# Patient Record
Sex: Female | Born: 1955
Health system: Southern US, Community
[De-identification: ages and names within clinical notes are randomized; demographics above are authoritative.]

## PROBLEM LIST (undated history)

## (undated) DIAGNOSIS — R12 Heartburn: Secondary | ICD-10-CM

## (undated) DIAGNOSIS — F329 Major depressive disorder, single episode, unspecified: Secondary | ICD-10-CM

## (undated) DIAGNOSIS — F32A Depression, unspecified: Secondary | ICD-10-CM

## (undated) DIAGNOSIS — E559 Vitamin D deficiency, unspecified: Secondary | ICD-10-CM

## (undated) DIAGNOSIS — M2550 Pain in unspecified joint: Secondary | ICD-10-CM

## (undated) DIAGNOSIS — M1990 Unspecified osteoarthritis, unspecified site: Secondary | ICD-10-CM

## (undated) DIAGNOSIS — M549 Dorsalgia, unspecified: Secondary | ICD-10-CM

## (undated) DIAGNOSIS — Z91018 Allergy to other foods: Secondary | ICD-10-CM

## (undated) DIAGNOSIS — R0602 Shortness of breath: Secondary | ICD-10-CM

## (undated) DIAGNOSIS — G43909 Migraine, unspecified, not intractable, without status migrainosus: Secondary | ICD-10-CM

## (undated) HISTORY — DX: Dorsalgia, unspecified: M54.9

## (undated) HISTORY — DX: Vitamin D deficiency, unspecified: E55.9

## (undated) HISTORY — PX: SHOULDER SURGERY: SHX246

## (undated) HISTORY — PX: KNEE SURGERY: SHX244

## (undated) HISTORY — DX: Pain in unspecified joint: M25.50

## (undated) HISTORY — DX: Shortness of breath: R06.02

## (undated) HISTORY — DX: Migraine, unspecified, not intractable, without status migrainosus: G43.909

## (undated) HISTORY — DX: Allergy to other foods: Z91.018

## (undated) HISTORY — DX: Depression, unspecified: F32.A

## (undated) HISTORY — DX: Heartburn: R12

## (undated) HISTORY — DX: Unspecified osteoarthritis, unspecified site: M19.90

---
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
## 1999-07-30 ENCOUNTER — Encounter: Payer: Self-pay | Admitting: Internal Medicine

## 1999-07-30 ENCOUNTER — Encounter: Admission: RE | Admit: 1999-07-30 | Discharge: 1999-07-30 | Payer: Self-pay | Admitting: Internal Medicine

## 2000-05-12 ENCOUNTER — Encounter (INDEPENDENT_AMBULATORY_CARE_PROVIDER_SITE_OTHER): Payer: Self-pay | Admitting: Specialist

## 2000-05-12 ENCOUNTER — Other Ambulatory Visit: Admission: RE | Admit: 2000-05-12 | Discharge: 2000-05-12 | Payer: Self-pay | Admitting: Obstetrics and Gynecology

## 2001-01-01 ENCOUNTER — Other Ambulatory Visit: Admission: RE | Admit: 2001-01-01 | Discharge: 2001-01-01 | Payer: Self-pay | Admitting: Obstetrics and Gynecology

## 2007-03-16 ENCOUNTER — Emergency Department (HOSPITAL_COMMUNITY): Admission: EM | Admit: 2007-03-16 | Discharge: 2007-03-16 | Payer: Self-pay | Admitting: Emergency Medicine

## 2007-03-30 ENCOUNTER — Ambulatory Visit (HOSPITAL_COMMUNITY): Admission: RE | Admit: 2007-03-30 | Discharge: 2007-03-30 | Payer: Self-pay | Admitting: Gastroenterology

## 2010-09-03 ENCOUNTER — Ambulatory Visit: Payer: BC Managed Care – PPO | Attending: Family Medicine | Admitting: Physical Therapy

## 2010-09-03 DIAGNOSIS — IMO0001 Reserved for inherently not codable concepts without codable children: Secondary | ICD-10-CM | POA: Insufficient documentation

## 2010-09-03 DIAGNOSIS — M25519 Pain in unspecified shoulder: Secondary | ICD-10-CM | POA: Insufficient documentation

## 2010-09-03 DIAGNOSIS — M25619 Stiffness of unspecified shoulder, not elsewhere classified: Secondary | ICD-10-CM | POA: Insufficient documentation

## 2011-03-21 LAB — URINALYSIS, ROUTINE W REFLEX MICROSCOPIC
Bilirubin Urine: NEGATIVE
Glucose, UA: NEGATIVE
Hgb urine dipstick: NEGATIVE
Ketones, ur: NEGATIVE
Nitrite: NEGATIVE
Protein, ur: NEGATIVE
Specific Gravity, Urine: 1.01
Urobilinogen, UA: 0.2
pH: 7.5

## 2011-03-21 LAB — CBC
HCT: 42
Hemoglobin: 14.5
MCHC: 34.4
MCV: 87.2
Platelets: 244
RBC: 4.82
RDW: 14
WBC: 8.2

## 2011-03-21 LAB — URINE MICROSCOPIC-ADD ON

## 2011-03-21 LAB — DIFFERENTIAL
Eosinophils Relative: 1
Lymphocytes Relative: 21
Lymphs Abs: 1.7
Monocytes Relative: 6

## 2013-05-26 ENCOUNTER — Other Ambulatory Visit: Payer: Self-pay | Admitting: Family Medicine

## 2013-05-26 DIAGNOSIS — R1012 Left upper quadrant pain: Secondary | ICD-10-CM

## 2013-05-28 ENCOUNTER — Ambulatory Visit
Admission: RE | Admit: 2013-05-28 | Discharge: 2013-05-28 | Disposition: A | Discharge status: Home / Self Care | Payer: BC Managed Care – PPO | Source: Ambulatory Visit | Attending: Family Medicine | Admitting: Family Medicine

## 2013-05-28 DIAGNOSIS — R1012 Left upper quadrant pain: Secondary | ICD-10-CM

## 2013-05-28 MED ORDER — IOHEXOL 300 MG/ML  SOLN: 100.0000 mL | Freq: Once | INTRAMUSCULAR | Status: AC | PRN

## 2013-12-27 IMAGING — CT CT ABDOMEN W/ CM
3 of 5 series · 15 of 32 positions shown, 19 images · IV contrast (READICAT/WATER & [ID] OMNI 300)
Comparison: CT of the abdomen and pelvis 03/09/2007.

CLINICAL DATA: Worsening left upper quadrant abdominal pain.
Left-sided flank pain.

EXAM:
CT ABDOMEN WITH CONTRAST
TECHNIQUE: Multidetector CT imaging of the abdomen was performed using the
standard protocol following bolus administration of intravenous
contrast.
CONTRAST:  100 mL of Omnipaque 300.

[Series 2: abd/pelvis with · axial · 0.86mm/px · z∈[-189,-44]mm · 3 of 59 slices shown, 7 images]
[im 15/59  soft-tissue]
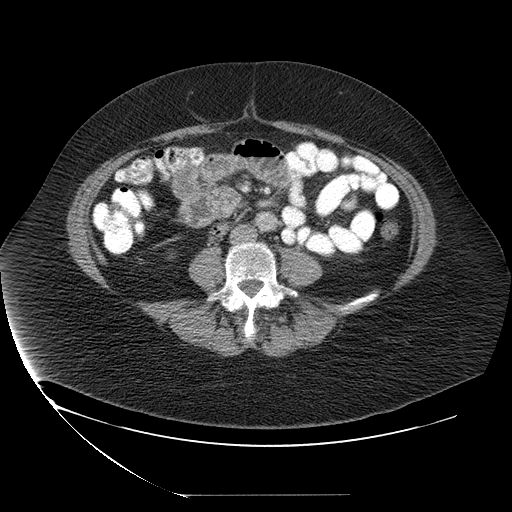
[im 15/59  lung]
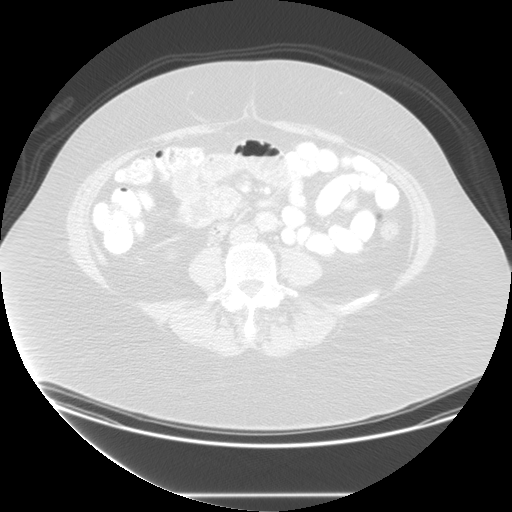
[im 15/59  bone]
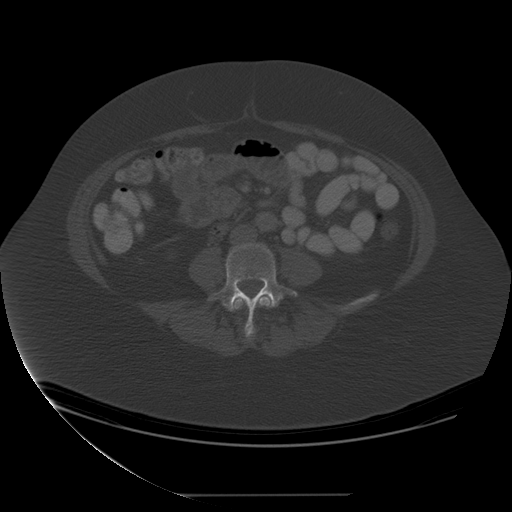
[im 30/59  soft-tissue]
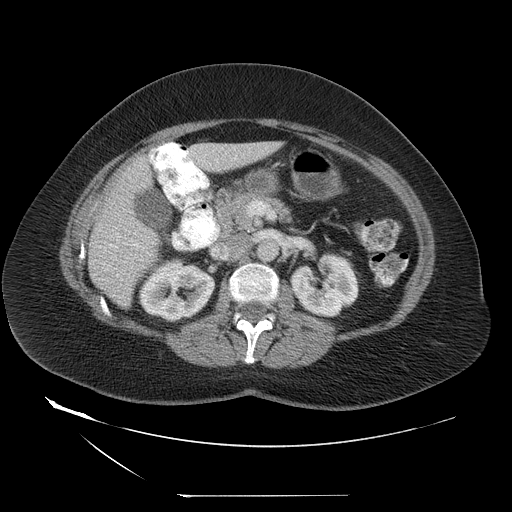
[im 30/59  lung]
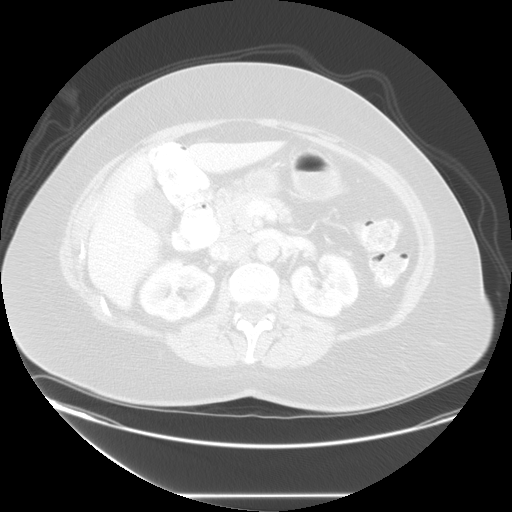
[im 44/59  soft-tissue]
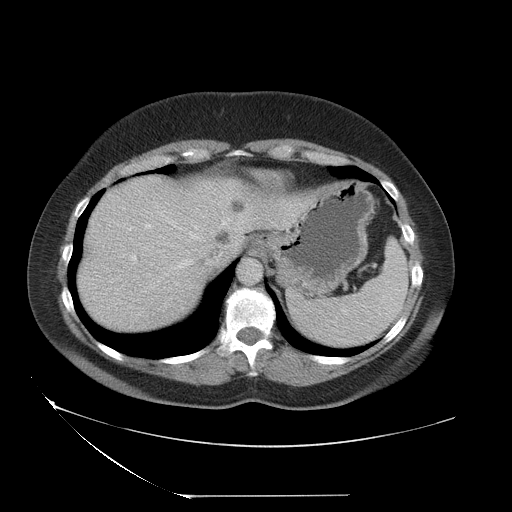
[im 44/59  lung]
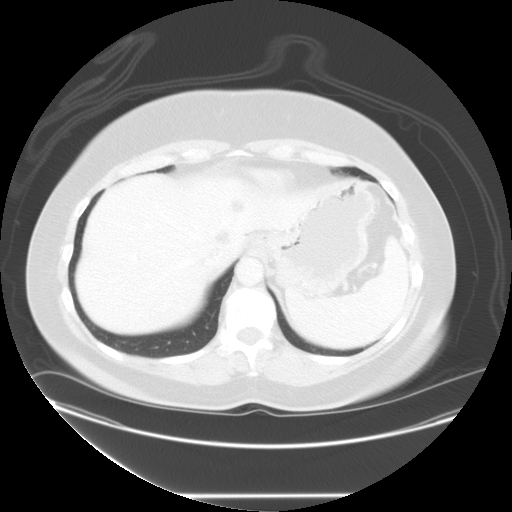

[Series 400: sag · sagittal · 0.86mm/px · 8 of 167 slices shown]
[im 14/167  soft-tissue]
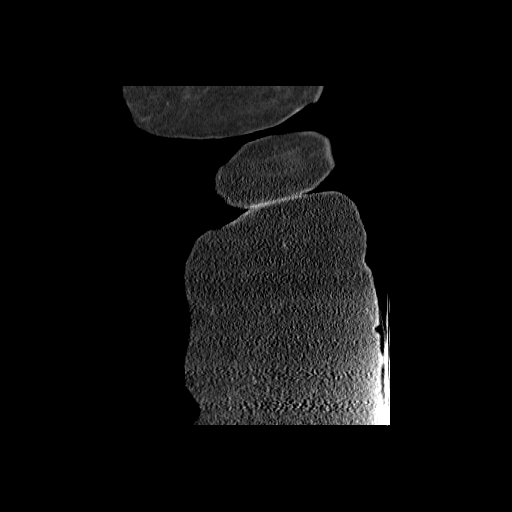
[im 42/167  soft-tissue]
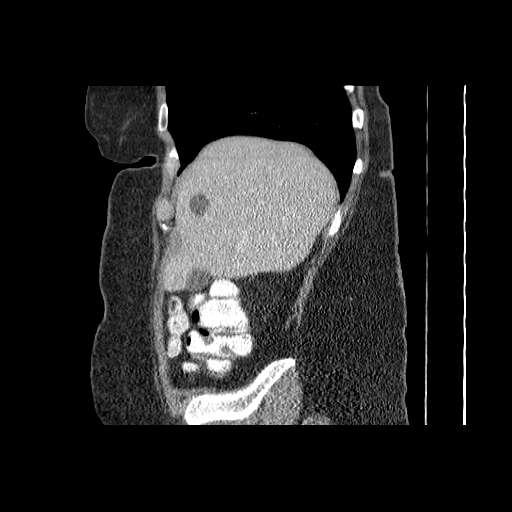
[im 56/167  soft-tissue]
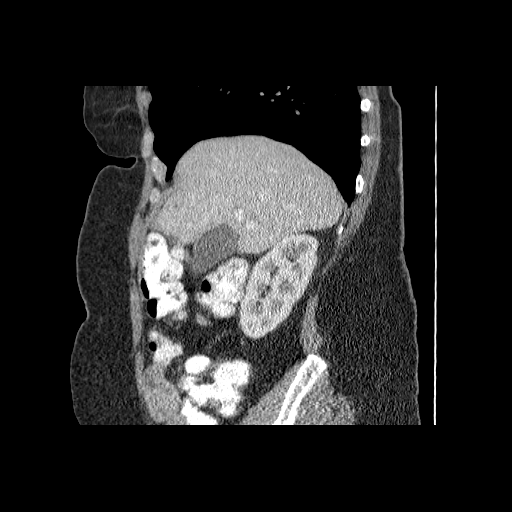
[im 70/167  soft-tissue]
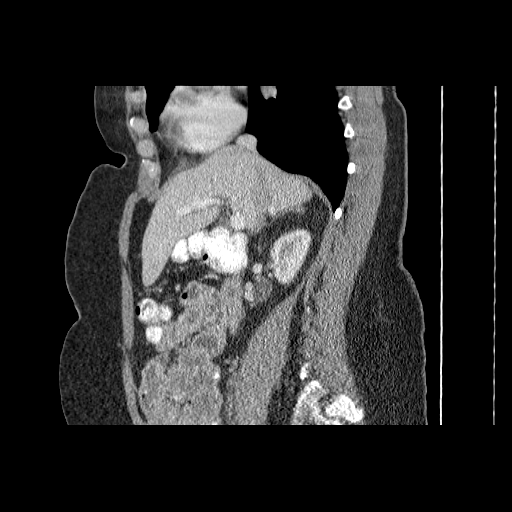
[im 97/167  soft-tissue]
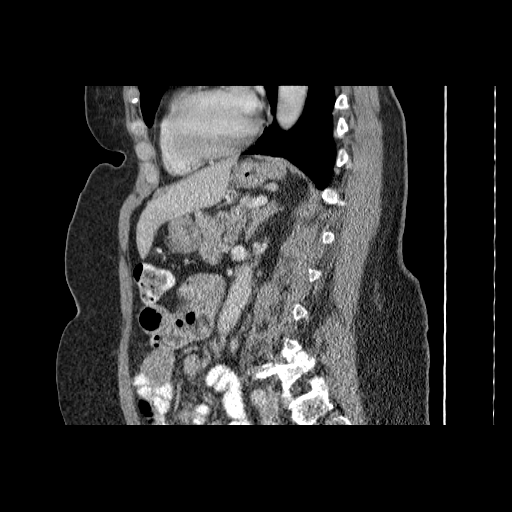
[im 111/167  soft-tissue]
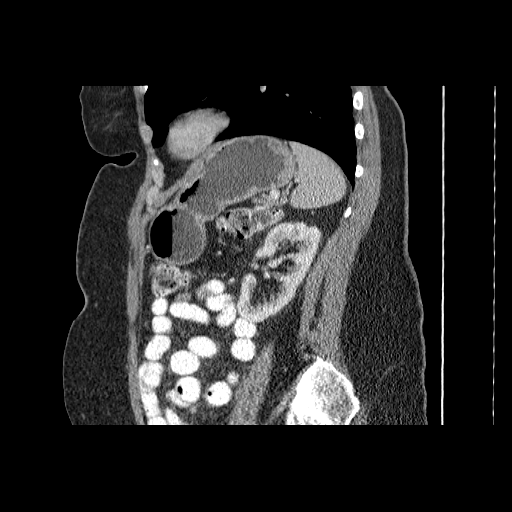
[im 125/167  soft-tissue]
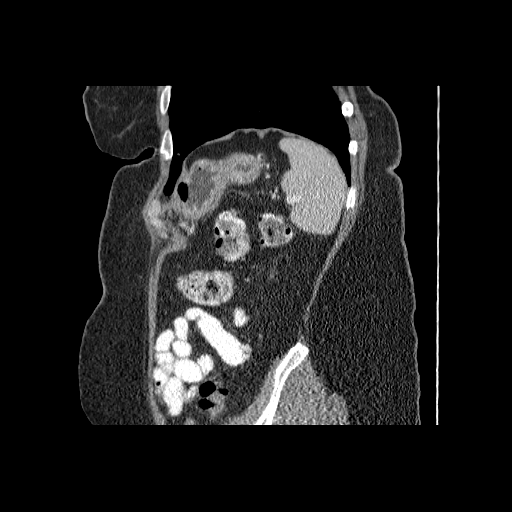
[im 153/167  soft-tissue]
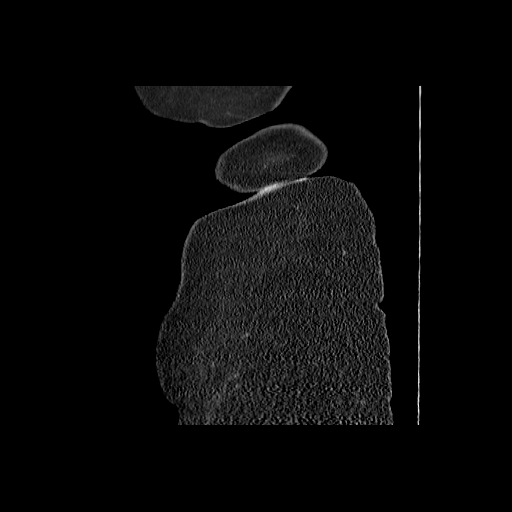

[Series 401: cor · coronal · 0.86mm/px · 4 of 120 slices shown]
[im 14/120  soft-tissue]
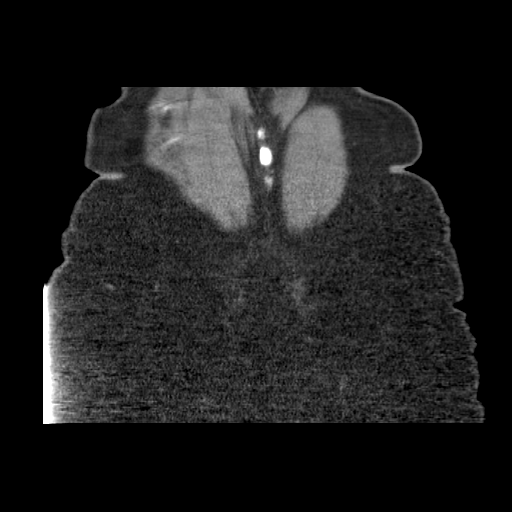
[im 27/120  soft-tissue]
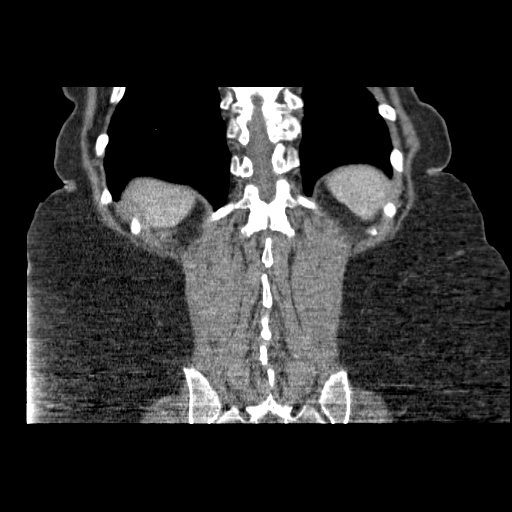
[im 40/120  soft-tissue]
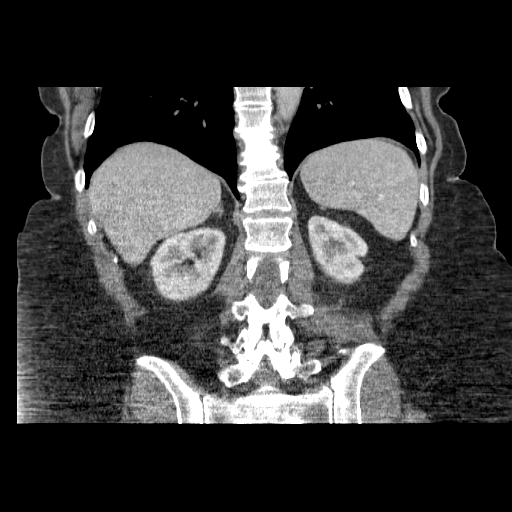
[im 53/120  soft-tissue]
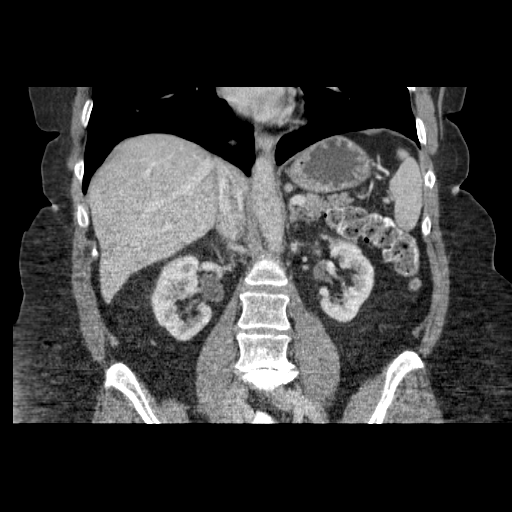

[15 of 32 positions shown; findings below may reference images not displayed]

FINDINGS: Lung Bases: Mild scarring in the inferior segment of the lingula and
medial segment of the right middle lobe.

Abdomen: Several small low-attenuation lesions in the liver are
predominantly too small to definitively characterize. The largest
hepatic lesion measures up to 2.1 cm in diameter in the central
liver at the junction of segments 4B and 5, with imaging
characteristics compatible with a small simple cyst. The other
hepatic lesions are similar in size, number and distribution to
remote prior study from 03/09/2007, therefore favored to represent
small cysts. The appearance of the gallbladder, pancreas, spleen,
bilateral adrenal glands and bilateral kidneys is unremarkable.

Within the visualized portions of the peritoneal cavity there is no
significant volume of ascites, no pneumoperitoneum and no pathologic
distention of small bowel. No definite lymphadenopathy in the
abdomen.

Musculoskeletal: There are no aggressive appearing lytic or blastic
lesions noted in the visualized portions of the skeleton. 6 mm of
anterolisthesis of L4 upon L5.
IMPRESSION: 1. No acute findings in the abdomen to account for the patient's
symptoms.
2. 6 mm of anterolisthesis of L4 upon L5.
3. Additional incidental findings, as above.

## 2017-11-10 DIAGNOSIS — M1712 Unilateral primary osteoarthritis, left knee: Secondary | ICD-10-CM | POA: Insufficient documentation

## 2018-12-30 DIAGNOSIS — Z1231 Encounter for screening mammogram for malignant neoplasm of breast: Secondary | ICD-10-CM | POA: Diagnosis not present

## 2019-01-26 DIAGNOSIS — Z1321 Encounter for screening for nutritional disorder: Secondary | ICD-10-CM | POA: Diagnosis not present

## 2019-01-26 DIAGNOSIS — Z13220 Encounter for screening for lipoid disorders: Secondary | ICD-10-CM | POA: Diagnosis not present

## 2019-01-26 DIAGNOSIS — R0600 Dyspnea, unspecified: Secondary | ICD-10-CM | POA: Diagnosis not present

## 2019-01-26 DIAGNOSIS — R079 Chest pain, unspecified: Secondary | ICD-10-CM | POA: Diagnosis not present

## 2019-01-26 DIAGNOSIS — G43909 Migraine, unspecified, not intractable, without status migrainosus: Secondary | ICD-10-CM | POA: Diagnosis not present

## 2019-01-26 DIAGNOSIS — Z1329 Encounter for screening for other suspected endocrine disorder: Secondary | ICD-10-CM | POA: Diagnosis not present

## 2019-02-04 DIAGNOSIS — R079 Chest pain, unspecified: Secondary | ICD-10-CM | POA: Diagnosis not present

## 2019-02-04 DIAGNOSIS — Z1211 Encounter for screening for malignant neoplasm of colon: Secondary | ICD-10-CM | POA: Diagnosis not present

## 2019-02-05 ENCOUNTER — Encounter: Payer: Self-pay | Admitting: Cardiology

## 2019-02-07 DIAGNOSIS — R079 Chest pain, unspecified: Secondary | ICD-10-CM | POA: Insufficient documentation

## 2019-02-07 DIAGNOSIS — R0602 Shortness of breath: Secondary | ICD-10-CM | POA: Insufficient documentation

## 2019-02-07 NOTE — Progress Notes (Signed)
Cardiology Office Note   Date:  02/08/2019   ID:  Deanna Hale, DOB [birth year], MRN [id number]  PCP:  Jamesetta Orleans, PA-C  Cardiologist:   Minus Breeding, MD Referring:  Jamesetta Orleans, PA-C   Chief Complaint  Patient presents with  . Chest Pain      History of Present Illness: Deanna Hale is a 63 y.o. female who is referred by Jamesetta Orleans, PA-C for evaluation of chest pain.  He has had no prior cardiac history although there is some family history of atrial fibrillation and what sounds like her brother having an ascending aortic aneurysm related to probably a bicuspid valve.  There is not an early onset coronary artery disease history however.  She has been getting chest discomfort.  This seems to have been going on for many months.  Has constant discomfort.  It can be 3 out of 10 in intensity.  It does not really wax and wane.  It is left upper chest tightness up into her neck.  Interestingly he has been wool into yarn  and has a repetitive motion associated with that activity on the left side.  She does paddle kayak and walks.  She does not necessarily bring on any symptoms with this.  She cannot induce any of the symptoms and she does notice a little more discomfort when she turns her head to the right.  She denies any associated nausea vomiting or diaphoresis.  She has no palpitations, presyncope or syncope.  She has no PND or orthopnea.  She is not had any prior cardiac testing other than a Holter monitor which demonstrated PVCs in the past.  She does feel the palpitations but this is been a stable pattern.  She does not describe presyncope or syncope.  Past Medical History:  Diagnosis Date  . Depression   . Migraines     Past Surgical History Shoulder surgery , Knee surgery  Current Outpatient Medications  Medication Sig Dispense Refill  . buPROPion (WELLBUTRIN) 75 MG tablet Take 75 mg by mouth 2 (two) times daily.    . SUMAtriptan (IMITREX) 100 MG tablet Take  100-200 mg by mouth every 2 (two) hours as needed for migraine. May repeat in 2 hours if headache persists or recurs.     No current facility-administered medications for this visit.     Allergies:   Fish-derived products and Penicillins    Social History:  The patient  reports that she has never smoked. She has never used smokeless tobacco. She reports current alcohol use. She reports that she does not use drugs.   Family History:  The patient's family history includes Aortic aneurysm (age of onset: 67) in her brother; Atrial fibrillation in her sister; Bladder Cancer (age of onset: 63) in her father; Depression in her brother and son; Heart failure (age of onset: 66) in her mother; Valvular heart disease in her brother.    ROS:  Please see the history of present illness.   Otherwise, review of systems are positive for none.   All other systems are reviewed and negative.    PHYSICAL EXAM: VS:  BP 128/90   Pulse 99   Temp (!) 97.3 F (36.3 C)   Ht 5\' 5"  (1.651 m)   Wt 210 lb (95.3 kg)   SpO2 98%   BMI 34.95 kg/m  , BMI Body mass index is 34.95 kg/m. GENERAL:  Well appearing HEENT:  Pupils equal round and reactive, fundi not  visualized, oral mucosa unremarkable NECK:  No jugular venous distention, waveform within normal limits, carotid upstroke brisk and symmetric, no bruits, no thyromegaly LYMPHATICS:  No cervical, inguinal adenopathy LUNGS:  Clear to auscultation bilaterally BACK:  No CVA tenderness CHEST:  Unremarkable HEART:  PMI not displaced or sustained,S1 and S2 within normal limits, no S3, no S4, no clicks, no rubs, no murmurs ABD:  Flat, positive bowel sounds normal in frequency in pitch, no bruits, no rebound, no guarding, no midline pulsatile mass, no hepatomegaly, no splenomegaly EXT:  2 plus pulses throughout, no edema, no cyanosis no clubbing SKIN:  No rashes no nodules NEURO:  Cranial nerves II through XII grossly intact, motor grossly intact throughout PSYCH:   Cognitively intact, oriented to person place and time    EKG:  EKG is not ordered today. The ekg ordered 01/26/19 demonstrates sinus rhythm, rate 69, axis within normal limits, intervals within normal limits, no acute ST-T wave changes.   Recent Labs: No results found for requested labs within last 8760 hours.    Lipid Panel No results found for: CHOL, TRIG, HDL, CHOLHDL, VLDL, LDLCALC, LDLDIRECT    Wt Readings from Last 3 Encounters:  02/08/19 210 lb (95.3 kg)      Other studies Reviewed: Additional studies/ records that were reviewed today include: Office records and labs. Review of the above records demonstrates:  Please see elsewhere in the note.     ASSESSMENT AND PLAN:  CHEST PAIN: This is atypical.  She has a normal exam.  She has an unremarkable EKG except for slightly leftward axis.  She does have some risk factors with mildly elevated LDL as below.  The pretest probability of obstructive coronary disease is low. I will bring the patient back for a POET (Plain Old Exercise Test). This will allow me to screen for obstructive coronary disease, risk stratify and very importantly provide a prescription for exercise.  ELEVATED LDL: She does have a mildly elevated LDL at 125 with an HDL of 79.  Given the good ratio no change in therapy is indicated.   Current medicines are reviewed at length with the patient today.  The patient does not have concerns regarding medicines.  The following changes have been made:  no change  Labs/ tests ordered today include:   Orders Placed This Encounter  Procedures  . Exercise Tolerance Test     Disposition:   FU with me as needed.      Signed, Minus Breeding, MD  02/08/2019 3:15 PM    [place]

## 2019-02-08 ENCOUNTER — Ambulatory Visit (INDEPENDENT_AMBULATORY_CARE_PROVIDER_SITE_OTHER): Payer: PPO | Admitting: Cardiology

## 2019-02-08 ENCOUNTER — Other Ambulatory Visit: Payer: Self-pay

## 2019-02-08 ENCOUNTER — Encounter: Payer: Self-pay | Admitting: Cardiology

## 2019-02-08 VITALS — BP 128/90 | HR 99 | Temp 97.3°F | Ht 65.0 in | Wt 210.0 lb

## 2019-02-08 DIAGNOSIS — R0602 Shortness of breath: Secondary | ICD-10-CM | POA: Diagnosis not present

## 2019-02-08 DIAGNOSIS — R0789 Other chest pain: Secondary | ICD-10-CM

## 2019-02-08 DIAGNOSIS — R079 Chest pain, unspecified: Secondary | ICD-10-CM

## 2019-02-08 NOTE — Patient Instructions (Addendum)
Medication Instructions:  Your physician recommends that you continue on your current medications as directed. Please refer to the Current Medication list given to you today.  If you need a refill on your cardiac medications before your next appointment, please call your pharmacy.   Lab work: COVID 19 SCREENING 3 Ali Chukson   If you have labs (blood work) drawn today and your tests are completely normal, you will receive your results only by: Marland Kitchen MyChart Message (if you have MyChart) OR . A paper copy in the mail If you have any lab test that is abnormal or we need to change your treatment, we will call you to review the results.  Testing/Procedures: Your physician has requested that you have an exercise tolerance test. For further information please visit [email or web address]. Please also follow instruction sheet, as given.   Follow-Up: AS NEEDED   Any Other Special Instructions Will Be Listed Below (If Applicable).   Exercise Stress Test An exercise stress test is a test to check how your heart works during exercise. You will need to walk on a treadmill or ride an exercise bike for this test. An electrocardiogram (ECG) will record your heartbeat when you are at rest and when you are exercising. You may have an ultrasound or nuclear test after the exercise test. The test is done to check for coronary artery disease (CAD). It is also done to:  See how well you can exercise.  Watch for high blood pressure during exercise.  Test how well you can exercise after treatment.  Check the blood flow to your arms and legs. If your test result is not normal, more testing may be needed. What happens before the procedure?  Follow instructions from your doctor about what you cannot eat or drink. ? Do not have any drinks or foods that have caffeine in them for 24 hours before the test, or as told by your doctor. This includes coffee, tea (even decaf tea),  sodas, chocolate, and cocoa.  Ask your doctor about changing or stopping your normal medicines. This is important if you: ? Take diabetes medicines. ? Take beta-blocker medicines. ? Wear a nitroglycerin patch.  If you use an inhaler, bring it with you to the test.  Do not put lotions, powders, creams, or oils on your chest before the test.  Wear comfortable shoes and clothing.  Do not use any products that have nicotine or tobacco in them, such as cigarettes and e-cigarettes. Stop using them at least 4 hours before the test. If you need help quitting, ask your doctor. What happens during the procedure?   Patches (electrodes) will be put on your chest.  Wires will be connected to the patches. The wires will send signals to a machine to record your heartbeat.  Your heart rate will be watched while you are resting and while you are exercising. Your blood pressure will also be watched during the test.  You will walk on a treadmill or use a stationary bike. If you cannot use these, you may be asked to turn a crank with your hands.  The activity will get harder and will raise your heart rate.  You may be asked to breathe into a tube a few times during the test. This measures the gases that you breathe out.  You will be asked how you are feeling throughout the test.  You will exercise until your heart reaches a target heart rate. You will stop early  if: ? You feel dizzy. ? You have chest pain. ? You are out of breath. ? Your blood pressure is too high or too low. ? You have an irregular heartbeat. ? You have pain or aching in your arms or legs. The procedure may vary among doctors and hospitals. What happens after the procedure?  Your blood pressure, heart rate, breathing rate, and blood oxygen level will be watched after the test.  You may return to your normal diet and activities as told by your doctor.  It is up to you to get the results of your test. Ask your doctor, or the  department that is doing the test, when your results will be ready. Summary  An exercise stress test is a test to check how your heart works during exercise.  This test is done to check for coronary artery disease.  Your heart rate will be watched while you are resting and while you are exercising.  Follow instructions from your doctor about what you cannot eat or drink before the test. This information is not intended to replace advice given to you by your health care provider. Make sure you discuss any questions you have with your health care provider. Document Released: 11/13/2007 Document Revised: 09/08/2018 Document Reviewed: 08/27/2016 Elsevier Patient Education  2020 [employer].

## 2019-02-10 ENCOUNTER — Telehealth: Payer: Self-pay

## 2019-02-10 NOTE — Telephone Encounter (Signed)
   [facility] Pre-operative Risk Assessment    Request for surgical clearance:  1. What type of surgery is being performed? COLONOSCOPY   2. When is this surgery scheduled? 04-12-2019   3. What type of clearance is required (medical clearance vs. Pharmacy clearance to hold med vs. Both)? MEDICAL  4. Are there any medications that need to be held prior to surgery and how long? NO   5. Practice name and name of physician performing surgery? [facility]   6. What is your office phone number [phone]    7.   What is your office fax number [phone]  8.   Anesthesia type (None, local, MAC, general) ? PROPOFOL   Waylan Rocher 02/10/2019, 1:56 PM  _________________________________________________________________   (provider comments below)

## 2019-02-12 DIAGNOSIS — L814 Other melanin hyperpigmentation: Secondary | ICD-10-CM | POA: Diagnosis not present

## 2019-02-12 DIAGNOSIS — L570 Actinic keratosis: Secondary | ICD-10-CM | POA: Diagnosis not present

## 2019-02-12 DIAGNOSIS — L821 Other seborrheic keratosis: Secondary | ICD-10-CM | POA: Diagnosis not present

## 2019-02-12 DIAGNOSIS — D1801 Hemangioma of skin and subcutaneous tissue: Secondary | ICD-10-CM | POA: Diagnosis not present

## 2019-02-12 DIAGNOSIS — L738 Other specified follicular disorders: Secondary | ICD-10-CM | POA: Diagnosis not present

## 2019-02-12 DIAGNOSIS — D225 Melanocytic nevi of trunk: Secondary | ICD-10-CM | POA: Diagnosis not present

## 2019-02-12 DIAGNOSIS — D485 Neoplasm of uncertain behavior of skin: Secondary | ICD-10-CM | POA: Diagnosis not present

## 2019-02-16 NOTE — Telephone Encounter (Signed)
Colonoscopy scheduled for Nov. Recently seen by Dr. Percival Spanish for atypical chest pain, pending POET on 9/18. Patient will be cleared if POET is low risk.

## 2019-02-23 ENCOUNTER — Other Ambulatory Visit (HOSPITAL_COMMUNITY)
Admission: RE | Admit: 2019-02-23 | Discharge: 2019-02-23 | Disposition: A | Discharge status: Home / Self Care | Payer: PPO | Source: Ambulatory Visit | Attending: Cardiology | Admitting: Cardiology

## 2019-02-23 DIAGNOSIS — Z01812 Encounter for preprocedural laboratory examination: Secondary | ICD-10-CM | POA: Insufficient documentation

## 2019-02-23 DIAGNOSIS — Z20828 Contact with and (suspected) exposure to other viral communicable diseases: Secondary | ICD-10-CM | POA: Diagnosis not present

## 2019-02-24 ENCOUNTER — Telehealth (HOSPITAL_COMMUNITY): Payer: Self-pay

## 2019-02-24 LAB — NOVEL CORONAVIRUS, NAA (HOSP ORDER, SEND-OUT TO REF LAB; TAT 18-24 HRS): SARS-CoV-2, NAA: NOT DETECTED

## 2019-02-24 NOTE — Telephone Encounter (Signed)
Encounter complete. 

## 2019-02-26 ENCOUNTER — Other Ambulatory Visit: Payer: Self-pay

## 2019-02-26 ENCOUNTER — Ambulatory Visit (HOSPITAL_COMMUNITY)
Admission: RE | Admit: 2019-02-26 | Discharge: 2019-02-26 | Disposition: A | Discharge status: Home / Self Care | Payer: PPO | Source: Ambulatory Visit | Attending: Cardiology | Admitting: Cardiology

## 2019-02-26 DIAGNOSIS — R0602 Shortness of breath: Secondary | ICD-10-CM

## 2019-02-26 DIAGNOSIS — R079 Chest pain, unspecified: Secondary | ICD-10-CM

## 2019-02-26 DIAGNOSIS — R0789 Other chest pain: Secondary | ICD-10-CM | POA: Diagnosis not present

## 2019-02-28 LAB — EXERCISE TOLERANCE TEST
Estimated workload: 4.6 METS
Exercise duration (min): 3 min
Exercise duration (sec): 2 s
MPHR: 155 {beats}/min
Peak HR: 151 {beats}/min
Percent HR: 97 %
RPE: 19
Rest HR: 103 {beats}/min

## 2019-03-01 NOTE — Telephone Encounter (Signed)
   Primary Cardiologist: Minus Breeding, MD  Chart reviewed as part of pre-operative protocol coverage. Given past medical history and time since last visit, based on ACC/AHA guidelines, Deanna Hale would be at acceptable risk for the planned procedure without further cardiovascular testing. Recent exercise tolerance test was negative, patient was able to achieve 4.6 METS of activity.  I will route this recommendation to the requesting party via Epic fax function and remove from pre-op pool.  Please call with questions.  Almyra Deforest, [place] 03/01/2019, 8:37 AM

## 2019-03-01 NOTE — Telephone Encounter (Signed)
see note on athesir, please inform patient that her recent treadmill stress test was low risk, however she has poor exercise tolerance. Also that she is cleared to proceed with colonoscopy  Left detailed message as above to CB if further explanation is needed. Almyra Deforest, PA-C forwarded to requesting party.

## 2019-03-09 DIAGNOSIS — F329 Major depressive disorder, single episode, unspecified: Secondary | ICD-10-CM | POA: Diagnosis not present

## 2019-04-12 DIAGNOSIS — D122 Benign neoplasm of ascending colon: Secondary | ICD-10-CM | POA: Diagnosis not present

## 2019-04-12 DIAGNOSIS — D128 Benign neoplasm of rectum: Secondary | ICD-10-CM | POA: Diagnosis not present

## 2019-04-12 DIAGNOSIS — K635 Polyp of colon: Secondary | ICD-10-CM | POA: Diagnosis not present

## 2019-04-12 DIAGNOSIS — Z1211 Encounter for screening for malignant neoplasm of colon: Secondary | ICD-10-CM | POA: Diagnosis not present

## 2019-04-12 DIAGNOSIS — K621 Rectal polyp: Secondary | ICD-10-CM | POA: Diagnosis not present

## 2019-07-02 ENCOUNTER — Ambulatory Visit: Payer: PPO | Attending: Internal Medicine

## 2019-07-02 DIAGNOSIS — Z23 Encounter for immunization: Secondary | ICD-10-CM | POA: Insufficient documentation

## 2019-07-02 NOTE — Progress Notes (Signed)
   Covid-19 Vaccination Clinic  Name:  Deanna Hale    MRN: [id number] DOB: [birth year]  07/02/2019  Deanna Hale was observed post Covid-19 immunization for 30 minutes based on pre-vaccination screening without incidence. She was provided with Vaccine Information Sheet and instruction to access the V-Safe system.   Deanna Hale was instructed to call 911 with any severe reactions post vaccine: Marland Kitchen Difficulty breathing  . Swelling of your face and throat  . A fast heartbeat  . A bad rash all over your body  . Dizziness and weakness    Immunizations Administered    Name Date Dose VIS Date Route   Pfizer COVID-19 Vaccine 07/02/2019  1:50 PM 0.3 mL 05/21/2019 Intramuscular   Manufacturer: [place]   Lot: [id number]   [place]: [id number]

## 2019-07-16 ENCOUNTER — Ambulatory Visit: Payer: PPO

## 2019-07-23 ENCOUNTER — Ambulatory Visit: Payer: PPO | Attending: Internal Medicine

## 2019-07-23 DIAGNOSIS — Z23 Encounter for immunization: Secondary | ICD-10-CM | POA: Insufficient documentation

## 2019-07-23 NOTE — Progress Notes (Signed)
   Covid-19 Vaccination Clinic  Name:  Deanna Hale    MRN: [id number] DOB: November 29, 1955  07/23/2019  [name] was observed post Covid-19 immunization for 30 minutes based on pre-vaccination screening without incidence. She was provided with Vaccine Information Sheet and instruction to access the V-Safe system.   [name] was instructed to call 911 with any severe reactions post vaccine: Marland Kitchen Difficulty breathing  . Swelling of your face and throat  . A fast heartbeat  . A bad rash all over your body  . Dizziness and weakness    Immunizations Administered    Name Date Dose VIS Date Route   Pfizer COVID-19 Vaccine 07/23/2019  1:37 PM 0.3 mL 05/21/2019 Intramuscular   Manufacturer: [place]   Lot: [id number]   [place]: [id number]

## 2020-02-23 DIAGNOSIS — Z1231 Encounter for screening mammogram for malignant neoplasm of breast: Secondary | ICD-10-CM | POA: Diagnosis not present

## 2020-07-19 DIAGNOSIS — Z13220 Encounter for screening for lipoid disorders: Secondary | ICD-10-CM | POA: Diagnosis not present

## 2020-07-19 DIAGNOSIS — J309 Allergic rhinitis, unspecified: Secondary | ICD-10-CM | POA: Diagnosis not present

## 2020-07-19 DIAGNOSIS — N289 Disorder of kidney and ureter, unspecified: Secondary | ICD-10-CM | POA: Diagnosis not present

## 2020-07-19 DIAGNOSIS — G43909 Migraine, unspecified, not intractable, without status migrainosus: Secondary | ICD-10-CM | POA: Diagnosis not present

## 2020-07-19 DIAGNOSIS — F32A Depression, unspecified: Secondary | ICD-10-CM | POA: Diagnosis not present

## 2020-07-21 DIAGNOSIS — F32A Depression, unspecified: Secondary | ICD-10-CM | POA: Diagnosis not present

## 2020-07-21 DIAGNOSIS — J309 Allergic rhinitis, unspecified: Secondary | ICD-10-CM | POA: Diagnosis not present

## 2020-07-21 DIAGNOSIS — G43909 Migraine, unspecified, not intractable, without status migrainosus: Secondary | ICD-10-CM | POA: Diagnosis not present

## 2020-07-21 DIAGNOSIS — Z13220 Encounter for screening for lipoid disorders: Secondary | ICD-10-CM | POA: Diagnosis not present

## 2020-07-21 DIAGNOSIS — R7989 Other specified abnormal findings of blood chemistry: Secondary | ICD-10-CM | POA: Diagnosis not present

## 2020-12-06 DIAGNOSIS — F338 Other recurrent depressive disorders: Secondary | ICD-10-CM | POA: Insufficient documentation

## 2020-12-06 DIAGNOSIS — Z6834 Body mass index (BMI) 34.0-34.9, adult: Secondary | ICD-10-CM | POA: Diagnosis not present

## 2020-12-06 DIAGNOSIS — E6609 Other obesity due to excess calories: Secondary | ICD-10-CM | POA: Diagnosis not present

## 2020-12-06 DIAGNOSIS — F331 Major depressive disorder, recurrent, moderate: Secondary | ICD-10-CM | POA: Diagnosis not present

## 2021-02-14 DIAGNOSIS — D2261 Melanocytic nevi of right upper limb, including shoulder: Secondary | ICD-10-CM | POA: Diagnosis not present

## 2021-02-14 DIAGNOSIS — L570 Actinic keratosis: Secondary | ICD-10-CM | POA: Diagnosis not present

## 2021-02-14 DIAGNOSIS — L820 Inflamed seborrheic keratosis: Secondary | ICD-10-CM | POA: Diagnosis not present

## 2021-02-14 DIAGNOSIS — D225 Melanocytic nevi of trunk: Secondary | ICD-10-CM | POA: Diagnosis not present

## 2021-02-14 DIAGNOSIS — L821 Other seborrheic keratosis: Secondary | ICD-10-CM | POA: Diagnosis not present

## 2021-02-14 DIAGNOSIS — D1801 Hemangioma of skin and subcutaneous tissue: Secondary | ICD-10-CM | POA: Diagnosis not present

## 2021-04-03 DIAGNOSIS — Z1231 Encounter for screening mammogram for malignant neoplasm of breast: Secondary | ICD-10-CM | POA: Diagnosis not present

## 2021-05-30 DIAGNOSIS — M1712 Unilateral primary osteoarthritis, left knee: Secondary | ICD-10-CM | POA: Diagnosis not present

## 2021-05-30 DIAGNOSIS — M25562 Pain in left knee: Secondary | ICD-10-CM | POA: Diagnosis not present

## 2021-09-11 DIAGNOSIS — T7803XA Anaphylactic reaction due to other fish, initial encounter: Secondary | ICD-10-CM | POA: Insufficient documentation

## 2022-03-06 DIAGNOSIS — E782 Mixed hyperlipidemia: Secondary | ICD-10-CM | POA: Insufficient documentation

## 2022-03-06 DIAGNOSIS — N1831 Chronic kidney disease, stage 3a: Secondary | ICD-10-CM | POA: Insufficient documentation

## 2022-03-06 DIAGNOSIS — F419 Anxiety disorder, unspecified: Secondary | ICD-10-CM | POA: Insufficient documentation

## 2022-11-13 DIAGNOSIS — G43909 Migraine, unspecified, not intractable, without status migrainosus: Secondary | ICD-10-CM | POA: Insufficient documentation

## 2022-11-14 ENCOUNTER — Encounter: Payer: Self-pay | Admitting: Bariatrics

## 2022-11-14 ENCOUNTER — Ambulatory Visit (INDEPENDENT_AMBULATORY_CARE_PROVIDER_SITE_OTHER): Payer: PPO | Admitting: Bariatrics

## 2022-11-14 VITALS — BP 129/87 | HR 96 | Temp 98.1°F | Ht 64.0 in | Wt 203.0 lb

## 2022-11-14 DIAGNOSIS — E65 Localized adiposity: Secondary | ICD-10-CM | POA: Diagnosis not present

## 2022-11-14 DIAGNOSIS — E669 Obesity, unspecified: Secondary | ICD-10-CM

## 2022-11-14 DIAGNOSIS — R5383 Other fatigue: Secondary | ICD-10-CM | POA: Diagnosis not present

## 2022-11-14 DIAGNOSIS — Z0289 Encounter for other administrative examinations: Secondary | ICD-10-CM

## 2022-11-14 DIAGNOSIS — Z6834 Body mass index (BMI) 34.0-34.9, adult: Secondary | ICD-10-CM | POA: Diagnosis not present

## 2022-11-14 NOTE — Progress Notes (Signed)
Office: [phone]  /  Fax: [phone]   Initial Visit  Deanna Hale was seen in clinic today to evaluate for obesity. She is interested in losing weight to improve overall health and reduce the risk of weight related complications. She presents today to review program treatment options, initial physical assessment, and evaluation.     She was referred by: Self-Referral ( Friend recommended ).   When asked what else they would like to accomplish? She states: Adopt healthier eating patterns, Improve energy levels and physical activity, Improve existing medical conditions, and Improve quality of life  When asked how has your weight affected you? She states: Contributed to medical problems and Having poor endurance  Some associated conditions: None  Contributing factors: Family history  Weight promoting medications identified: None  Current nutrition plan: None and Other: Elimination diet   Current level of physical activity: Walking and Other: Yard work  Current or previous pharmacotherapy: None  Response to medication: Never tried medications   Past medical history includes:   Past Medical History:  Diagnosis Date   Depression    Migraines      Objective:   BP 129/87   Pulse 96   Temp 98.1 F (36.7 C)   Ht 5\' 4"  (1.626 m)   Wt 203 lb (92.1 kg)   LMP  (LMP Unknown)   SpO2 98%   BMI 34.84 kg/m  She was weighed on the bioimpedance scale: Body mass index is 34.84 kg/m.  Peak Weight:209 , Body Fat%:44.6, Visceral Fat Rating:14, Weight trend over the last 12 months: Unchanged  General:  Alert, oriented and cooperative. Patient is in no acute distress.  Respiratory: Normal respiratory effort, no problems with respiration noted  Extremities: Normal range of motion.    Mental Status: Normal mood and affect. Normal behavior. Normal judgment and thought content.   DIAGNOSTIC DATA REVIEWED:  BMET No results found for: "NA", "K", "CL", "CO2", "GLUCOSE", "BUN",  "CREATININE", "CALCIUM", "GFRNONAA", "GFRAA" No results found for: "HGBA1C" No results found for: "INSULIN" CBC    Component Value Date/Time   WBC 8.2 03/16/2007 1646   RBC 4.82 03/16/2007 1646   HGB 14.5 03/16/2007 1646   HCT 42.0 03/16/2007 1646   PLT 244 03/16/2007 1646   MCV 87.2 03/16/2007 1646   MCHC 34.4 03/16/2007 1646   RDW 14.0 03/16/2007 1646   Iron/TIBC/Ferritin/ %Sat No results found for: "IRON", "TIBC", "FERRITIN", "IRONPCTSAT" Lipid Panel  No results found for: "CHOL", "TRIG", "HDL", "CHOLHDL", "VLDL", "LDLCALC", "LDLDIRECT" Hepatic Function Panel  No results found for: "PROT", "ALBUMIN", "AST", "ALT", "ALKPHOS", "BILITOT", "BILIDIR", "IBILI" No results found for: "TSH"   Assessment and Plan:   Visceral Obesity.   She has a visceral fat rating of 14 per the bio-impedence scale.   Plan: The goal is a visceral fat rating of 12 or below.  Will work on the plan and increase exercise/begin exercise.  Information sheet on " Tips to lose belly fat ". Aware that belly fat may be equate to visceral fat, but many of the same tips can help both subcutaneous and visceral fat.  Information sheet on " Healthy and Unhealthy fats.  Will minimize all carbohydrates ( sweets and starches ).    Fatigue:   She has mild fatigue and lower energy levels at time. She also has some mild dysthymic feeling periodically.   Plan; Will do labs at her next visit.     Generalized Obesity: Current BMI 34    Obesity Treatment / Action  Plan:  Patient will work on garnering support from family and friends to begin weight loss journey. Will work on eliminating or reducing the presence of highly palatable, calorie dense foods in the home. Will complete provided nutritional and psychosocial assessment questionnaire before the next appointment. Will be scheduled for indirect calorimetry to determine resting energy expenditure in a fasting state.  This will allow [place] to create a reduced  calorie, high-protein meal plan to promote loss of fat mass while preserving muscle mass. Counseled on the health benefits of losing 5%-15% of total body weight. Was counseled on nutritional approaches to weight loss and benefits of reducing processed foods and consuming plant-based foods and high quality protein as part of nutritional weight management. Was counseled on pharmacotherapy and role as an adjunct in weight management.   Obesity Education Performed Today:  She was weighed on the bioimpedance scale and results were discussed and documented in the synopsis.  We discussed obesity as a disease and the importance of a more detailed evaluation of all the factors contributing to the disease.  We discussed the importance of long term lifestyle changes which include nutrition, exercise and behavioral modifications as well as the importance of customizing this to her specific health and social needs.  We discussed the benefits of reaching a healthier weight to alleviate the symptoms of existing conditions and reduce the risks of the biomechanical, metabolic and psychological effects of obesity.  Discussed New Patient/Late Arrival, and Cancellation Policies. Patient voiced understanding and allowed to ask questions.   Deanna Hale appears to be in the action stage of change and states they are ready to start intensive lifestyle modifications and behavioral modifications.  30 minutes was spent today on this visit including the above counseling, pre-visit chart review, and post-visit documentation.  Reviewed by clinician on day of visit: allergies, medications, problem list, medical history, surgical history, family history, social history, and previous encounter notes.    Deanna Hale A. Lorretta HarpO.

## 2022-11-27 ENCOUNTER — Encounter: Payer: Self-pay | Admitting: Bariatrics

## 2022-11-27 ENCOUNTER — Ambulatory Visit (INDEPENDENT_AMBULATORY_CARE_PROVIDER_SITE_OTHER): Payer: PPO | Admitting: Bariatrics

## 2022-11-27 VITALS — BP 126/84 | HR 92 | Temp 97.8°F | Ht 64.0 in | Wt 203.0 lb

## 2022-11-27 DIAGNOSIS — E65 Localized adiposity: Secondary | ICD-10-CM

## 2022-11-27 DIAGNOSIS — R7309 Other abnormal glucose: Secondary | ICD-10-CM | POA: Insufficient documentation

## 2022-11-27 DIAGNOSIS — Z1331 Encounter for screening for depression: Secondary | ICD-10-CM | POA: Diagnosis not present

## 2022-11-27 DIAGNOSIS — E669 Obesity, unspecified: Secondary | ICD-10-CM | POA: Insufficient documentation

## 2022-11-27 DIAGNOSIS — Z0000 Encounter for general adult medical examination without abnormal findings: Secondary | ICD-10-CM

## 2022-11-27 DIAGNOSIS — R5383 Other fatigue: Secondary | ICD-10-CM | POA: Diagnosis not present

## 2022-11-27 DIAGNOSIS — R0602 Shortness of breath: Secondary | ICD-10-CM

## 2022-11-27 DIAGNOSIS — Z6833 Body mass index (BMI) 33.0-33.9, adult: Secondary | ICD-10-CM | POA: Insufficient documentation

## 2022-11-27 DIAGNOSIS — E559 Vitamin D deficiency, unspecified: Secondary | ICD-10-CM

## 2022-11-27 DIAGNOSIS — Z6834 Body mass index (BMI) 34.0-34.9, adult: Secondary | ICD-10-CM

## 2022-11-28 ENCOUNTER — Encounter (INDEPENDENT_AMBULATORY_CARE_PROVIDER_SITE_OTHER): Payer: Self-pay | Admitting: Bariatrics

## 2022-11-28 DIAGNOSIS — E7800 Pure hypercholesterolemia, unspecified: Secondary | ICD-10-CM | POA: Insufficient documentation

## 2022-11-28 LAB — CBC WITH DIFFERENTIAL/PLATELET
Basophils Absolute: 0.1 10*3/uL (ref 0.0–0.2)
Basos: 1 %
EOS (ABSOLUTE): 0.1 10*3/uL (ref 0.0–0.4)
Eos: 1 %
Hematocrit: 46 % (ref 34.0–46.6)
Hemoglobin: 15.2 g/dL (ref 11.1–15.9)
Immature Grans (Abs): 0 10*3/uL (ref 0.0–0.1)
Immature Granulocytes: 0 %
Lymphocytes Absolute: 1.8 10*3/uL (ref 0.7–3.1)
Lymphs: 26 %
MCH: 29.2 pg (ref 26.6–33.0)
MCHC: 33 g/dL (ref 31.5–35.7)
MCV: 88 fL (ref 79–97)
Monocytes Absolute: 0.5 10*3/uL (ref 0.1–0.9)
Monocytes: 8 %
Neutrophils Absolute: 4.4 10*3/uL (ref 1.4–7.0)
Neutrophils: 64 %
Platelets: 281 10*3/uL (ref 150–450)
RBC: 5.21 x10E6/uL (ref 3.77–5.28)
RDW: 13 % (ref 11.7–15.4)
WBC: 6.9 10*3/uL (ref 3.4–10.8)

## 2022-11-28 LAB — COMPREHENSIVE METABOLIC PANEL
ALT: 17 IU/L (ref 0–32)
AST: 21 IU/L (ref 0–40)
Albumin: 4.7 g/dL (ref 3.9–4.9)
Alkaline Phosphatase: 107 IU/L (ref 44–121)
BUN/Creatinine Ratio: 13 (ref 12–28)
BUN: 13 mg/dL (ref 8–27)
Bilirubin Total: 0.4 mg/dL (ref 0.0–1.2)
CO2: 25 mmol/L (ref 20–29)
Calcium: 9.8 mg/dL (ref 8.7–10.3)
Chloride: 103 mmol/L (ref 96–106)
Creatinine, Ser: 0.99 mg/dL (ref 0.57–1.00)
Globulin, Total: 2.3 g/dL (ref 1.5–4.5)
Glucose: 97 mg/dL (ref 70–99)
Potassium: 4.8 mmol/L (ref 3.5–5.2)
Sodium: 141 mmol/L (ref 134–144)
Total Protein: 7 g/dL (ref 6.0–8.5)
eGFR: 62 mL/min/{1.73_m2} (ref >59)

## 2022-11-28 LAB — VITAMIN D 25 HYDROXY (VIT D DEFICIENCY, FRACTURES): Vit D, 25-Hydroxy: 32.6 ng/mL (ref 30.0–100.0)

## 2022-11-28 LAB — LIPID PANEL WITH LDL/HDL RATIO
Cholesterol, Total: 253 mg/dL — ABNORMAL HIGH (ref 100–199)
HDL: 85 mg/dL (ref >39)
LDL Chol Calc (NIH): 151 mg/dL — ABNORMAL HIGH (ref 0–99)
LDL/HDL Ratio: 1.8 ratio (ref 0.0–3.2)
Triglycerides: 98 mg/dL (ref 0–149)
VLDL Cholesterol Cal: 17 mg/dL (ref 5–40)

## 2022-11-28 LAB — TSH+T4F+T3FREE
Free T4: 1.42 ng/dL (ref 0.82–1.77)
T3, Free: 2.7 pg/mL (ref 2.0–4.4)
TSH: 1.37 u[IU]/mL (ref 0.450–4.500)

## 2022-11-28 LAB — HEMOGLOBIN A1C
Est. average glucose Bld gHb Est-mCnc: 114 mg/dL
Hgb A1c MFr Bld: 5.6 % (ref 4.8–5.6)

## 2022-11-28 LAB — INSULIN, RANDOM: INSULIN: 7.7 u[IU]/mL (ref 2.6–24.9)

## 2022-12-02 ENCOUNTER — Encounter: Payer: Self-pay | Admitting: Bariatrics

## 2022-12-03 NOTE — Progress Notes (Unsigned)
Chief Complaint:   OBESITY Deanna Hale (MR# [id number]) is a 67 y.o. female who presents for evaluation and treatment of obesity and related comorbidities. Current BMI is Body mass index is 34.84 kg/m. Deanna Hale has been struggling with her weight for many years and has been unsuccessful in either losing weight, maintaining weight loss, or reaching her healthy weight goal.  Deanna Hale is currently in the action stage of change and ready to dedicate time achieving and maintaining a healthier weight. Deanna Hale is interested in becoming our patient and working on intensive lifestyle modifications including (but not limited to) diet and exercise for weight loss.  Patient is here for her initial visit, and she was seen for an information session on 11/14/2022.  Deanna Hale's habits were reviewed today and are as follows: her desired weight loss is 43 lbs, she has been heavy most of her life, she started gaining weight gradually, her heaviest weight ever was 209 pounds, she has significant food cravings issues, she snacks frequently in the evenings, she skips meals frequently, she is frequently drinking liquids with calories, she frequently makes poor food choices, she frequently eats larger portions than normal, and she struggles with emotional eating.  Depression Screen Deanna Hale's Food and Mood (modified PHQ-9) score was 14.  Subjective:   1. Other fatigue Deanna Hale admits to daytime somnolence and admits to waking up still tired. Patient has a history of symptoms of daytime fatigue, morning fatigue, and morning headache. Deanna Hale generally gets 7 or 8 hours of sleep per night, and states that she has nightime awakenings and generally restful sleep. Snoring is present. Apneic episodes are not present. Epworth Sleepiness Score is 6.   2. SOB (shortness of breath) on exertion Deanna Hale notes increasing shortness of breath with exercising and seems to be worsening over time with weight gain. She notes getting out of breath  sooner with activity than she used to. This has not gotten worse recently. Deanna Hale denies shortness of breath at rest or orthopnea.  3. Visceral obesity Patient's visceral fat rating is 14.  4. Vitamin D deficiency Patient is taking [employer] women's multivitamins and biotin.  5. Health care maintenance Given obesity.  6. Elevated glucose Patient is not on medications.  Assessment/Plan:   1. Other fatigue Deanna Hale does feel that her weight is causing her energy to be lower than it should be. Fatigue may be related to obesity, depression or many other causes. Labs will be ordered, and in the meanwhile, Deanna Hale will focus on self care including making healthy food choices, increasing physical activity and focusing on stress reduction.  - EKG 12-Lead - TSH+T4F+T3Free  2. SOB (shortness of breath) on exertion Deanna Hale does feel that she gets out of breath more easily that she used to when she exercises. Deanna Hale's shortness of breath appears to be obesity related and exercise induced. She has agreed to work on weight loss and gradually increase exercise to treat her exercise induced shortness of breath. Will continue to monitor closely.  - TSH+T4F+T3Free  3. Visceral obesity Goal visceral fat rating is 13 or less.  4. Vitamin D deficiency We will check labs today, we will follow-up at patient's next visit.  - VITAMIN D 25 Hydroxy (Vit-D Deficiency, Fractures)  5. Health care maintenance We will check labs today.  EKG and IC were reviewed and discussed with the patient today.  - Hemoglobin A1c - Insulin, random - Lipid Panel With LDL/HDL Ratio - TSH+T4F+T3Free - VITAMIN D 25 Hydroxy (Vit-D Deficiency, Fractures) -  CBC with Differential/Platelet - Comprehensive metabolic panel  6. Elevated glucose We will check labs today, and we will follow-up at patient's next visit.  - Hemoglobin A1c - Insulin, random  7. Depression screening Deanna Hale had a positive depression screening. Depression is  commonly associated with obesity and often results in emotional eating behaviors. We will monitor this closely and work on CBT to help improve the non-hunger eating patterns. Referral to Psychology may be required if no improvement is seen as she continues in our clinic.  8. Generalized obesity  9. BMI 34.0-34.9,adult Deanna Hale is currently in the action stage of change and her goal is to continue with weight loss efforts. I recommend Deanna Hale begin the structured treatment plan as follows:  She has agreed to the Category 2 Plan.  Meal planning and intentional eating were discussed.  Exercise goals: No exercise has been prescribed at this time.   Behavioral modification strategies: increasing lean protein intake, decreasing simple carbohydrates, increasing vegetables, increasing water intake, decreasing eating out, no skipping meals, meal planning and cooking strategies, keeping healthy foods in the home, and planning for success.  She was informed of the importance of frequent follow-up visits to maximize her success with intensive lifestyle modifications for her multiple health conditions. She was informed we would discuss her lab results at her next visit unless there is a critical issue that needs to be addressed sooner. Deanna Hale agreed to keep her next visit at the agreed upon time to discuss these results.  Objective:   Blood pressure 126/84, pulse 92, temperature 97.8 F (36.6 C), height 5\' 4"  (1.626 m), weight 203 lb (92.1 kg), SpO2 97 %. Body mass index is 34.84 kg/m.  EKG: Normal sinus rhythm, rate 90 BPM.  Indirect Calorimeter completed today shows a VO2 of 220 and a REE of 1512.  Her calculated basal metabolic rate is 3329 thus her basal metabolic rate is worse than expected.  General: Cooperative, alert, well developed, in no acute distress. HEENT: Conjunctivae and lids unremarkable. Cardiovascular: Regular rhythm.  Lungs: Normal work of breathing. Neurologic: No focal deficits.    Lab Results  Component Value Date   CREATININE 0.99 11/27/2022   BUN 13 11/27/2022   NA 141 11/27/2022   K 4.8 11/27/2022   CL 103 11/27/2022   CO2 25 11/27/2022   Lab Results  Component Value Date   ALT 17 11/27/2022   AST 21 11/27/2022   ALKPHOS 107 11/27/2022   BILITOT 0.4 11/27/2022   Lab Results  Component Value Date   HGBA1C 5.6 11/27/2022   Lab Results  Component Value Date   INSULIN 7.7 11/27/2022   Lab Results  Component Value Date   TSH 1.370 11/27/2022   Lab Results  Component Value Date   CHOL 253 (H) 11/27/2022   HDL 85 11/27/2022   LDLCALC 151 (H) 11/27/2022   TRIG 98 11/27/2022   Lab Results  Component Value Date   WBC 6.9 11/27/2022   HGB 15.2 11/27/2022   HCT 46.0 11/27/2022   MCV 88 11/27/2022   PLT 281 11/27/2022   No results found for: "IRON", "TIBC", "FERRITIN"  Attestation Statements:   Reviewed by clinician on day of visit: allergies, medications, problem list, medical history, surgical history, family history, social history, and previous encounter notes.   Trude Mcburney, am acting as Energy manager for [employer], DO.  I have reviewed the above documentation for accuracy and completeness, and I agree with the above. - ***

## 2022-12-11 ENCOUNTER — Encounter: Payer: Self-pay | Admitting: Bariatrics

## 2022-12-11 ENCOUNTER — Ambulatory Visit (INDEPENDENT_AMBULATORY_CARE_PROVIDER_SITE_OTHER): Payer: PPO | Admitting: Bariatrics

## 2022-12-11 VITALS — BP 112/76 | HR 101 | Temp 97.9°F | Ht 64.0 in | Wt 197.0 lb

## 2022-12-11 DIAGNOSIS — E669 Obesity, unspecified: Secondary | ICD-10-CM

## 2022-12-11 DIAGNOSIS — Z6833 Body mass index (BMI) 33.0-33.9, adult: Secondary | ICD-10-CM

## 2022-12-11 DIAGNOSIS — E559 Vitamin D deficiency, unspecified: Secondary | ICD-10-CM

## 2022-12-11 DIAGNOSIS — E7800 Pure hypercholesterolemia, unspecified: Secondary | ICD-10-CM | POA: Diagnosis not present

## 2022-12-11 MED ORDER — TRULICITY 0.75 MG/0.5ML ~~LOC~~ SOAJ: 0.7500 mg | SUBCUTANEOUS | 0 refills | Status: DC

## 2022-12-11 MED ORDER — VITAMIN D (ERGOCALCIFEROL) 1.25 MG (50000 UNIT) PO CAPS: 50000.0000 [IU] | ORAL_CAPSULE | ORAL | 0 refills | Status: DC

## 2022-12-11 NOTE — Progress Notes (Unsigned)
Chief Complaint:   OBESITY Deanna Hale is here to discuss her progress with her obesity treatment plan along with follow-up of her obesity related diagnoses. Deanna Hale is on the Category 2 Plan and states she is following her eating plan approximately 100% of the time. Deanna Hale states she is active while doing yard work for 30 minutes 3 times per week.  Today's visit was #: 2 Starting weight: 203 lbs Starting date: 11/27/2022 Today's weight: 197 lbs Today's date: 12/11/2022 Total lbs lost to date: 6 Total lbs lost since last in-office visit: 6  Interim History: Patient is down 6 pounds since her last visit.  She is using the Licensed conveyancer.  Subjective:   1. Vitamin D deficiency Patient's recent vitamin D level was 32.5.  2. Elevated cholesterol Patient's recent total cholesterol was 253, LDL 151, and HDL 85.  Assessment/Plan:   1. Vitamin D deficiency Patient agreed to start prescription vitamin D 50,000 IU once weekly with a 90-day supply.  - Vitamin D, Ergocalciferol, (DRISDOL) 1.25 MG (50000 UNIT) CAPS capsule; Take 1 capsule (50,000 Units total) by mouth every 7 (seven) days.  Dispense: 12 capsule; Refill: 0  2. Elevated cholesterol Patient will continue with her meal plan and activity.  3. Generalized obesity  4. BMI 33.0-33.9,adult Deanna Hale is currently in the action stage of change. As such, her goal is to continue with weight loss efforts. She has agreed to the Category 2 Plan.   Meal planning was discussed. Patient will continue to adhere closely to the meal plan. Reviewed labs with the patient from 12/07/2022, CMP, lipid, Vit D, CBC, A1c, insulin, and thyroid panel. List of brands. Discussed Victoza in detail/no contraindications.   Exercise goals: As is.   Behavioral modification strategies: increasing lean protein intake, decreasing simple carbohydrates, increasing vegetables, increasing water intake, decreasing eating out, no skipping meals, meal planning and cooking  strategies, and keeping healthy foods in the home.  [place] has agreed to follow-up with our clinic in 2 weeks. She was informed of the importance of frequent follow-up visits to maximize her success with intensive lifestyle modifications for her multiple health conditions.   Objective:   Blood pressure 112/76, pulse (!) 101, temperature 97.9 F (36.6 C), height 5\' 4"  (1.626 m), weight 197 lb (89.4 kg), SpO2 97 %. Body mass index is 33.81 kg/m.  General: Cooperative, alert, well developed, in no acute distress. HEENT: Conjunctivae and lids unremarkable. Cardiovascular: Regular rhythm.  Lungs: Normal work of breathing. Neurologic: No focal deficits.   Lab Results  Component Value Date   CREATININE 0.99 11/27/2022   BUN 13 11/27/2022   NA 141 11/27/2022   K 4.8 11/27/2022   CL 103 11/27/2022   CO2 25 11/27/2022   Lab Results  Component Value Date   ALT 17 11/27/2022   AST 21 11/27/2022   ALKPHOS 107 11/27/2022   BILITOT 0.4 11/27/2022   Lab Results  Component Value Date   HGBA1C 5.6 11/27/2022   Lab Results  Component Value Date   INSULIN 7.7 11/27/2022   Lab Results  Component Value Date   TSH 1.370 11/27/2022   Lab Results  Component Value Date   CHOL 253 (H) 11/27/2022   HDL 85 11/27/2022   LDLCALC 151 (H) 11/27/2022   TRIG 98 11/27/2022   Lab Results  Component Value Date   VD25OH 32.6 11/27/2022   Lab Results  Component Value Date   WBC 6.9 11/27/2022   HGB 15.2 11/27/2022   HCT 46.0  11/27/2022   MCV 88 11/27/2022   PLT 281 11/27/2022   No results found for: "IRON", "TIBC", "FERRITIN"  Attestation Statements:   Reviewed by clinician on day of visit: allergies, medications, problem list, medical history, surgical history, family history, social history, and previous encounter notes.   Trude Mcburney, am acting as Energy manager for [employer], DO.  I have reviewed the above documentation for accuracy and completeness, and I agree with the  above. -  ***

## 2022-12-13 ENCOUNTER — Encounter: Payer: Self-pay | Admitting: Bariatrics

## 2022-12-30 ENCOUNTER — Encounter: Payer: Self-pay | Admitting: Nurse Practitioner

## 2022-12-30 ENCOUNTER — Ambulatory Visit (INDEPENDENT_AMBULATORY_CARE_PROVIDER_SITE_OTHER): Payer: PPO | Admitting: Nurse Practitioner

## 2022-12-30 VITALS — BP 122/83 | HR 106 | Temp 98.1°F | Ht 64.0 in | Wt 192.0 lb

## 2022-12-30 DIAGNOSIS — E7800 Pure hypercholesterolemia, unspecified: Secondary | ICD-10-CM | POA: Diagnosis not present

## 2022-12-30 DIAGNOSIS — E669 Obesity, unspecified: Secondary | ICD-10-CM | POA: Diagnosis not present

## 2022-12-30 DIAGNOSIS — R000 Tachycardia, unspecified: Secondary | ICD-10-CM

## 2022-12-30 DIAGNOSIS — Z6832 Body mass index (BMI) 32.0-32.9, adult: Secondary | ICD-10-CM

## 2022-12-30 NOTE — Progress Notes (Signed)
Office: [phone]  /  Fax: [phone]  WEIGHT SUMMARY AND BIOMETRICS  Weight Lost Since Last Visit: 5lb  No data recorded  Vitals Temp: 98.1 F (36.7 C) BP: 122/83 Pulse Rate: (!) 106 SpO2: 98 %   Anthropometric Measurements Height: 5\' 4"  (1.626 m) Weight: 192 lb (87.1 kg) BMI (Calculated): 32.94 Weight at Last Visit: 197lb Weight Lost Since Last Visit: 5lb Starting Weight: 203lb Total Weight Loss (lbs): 11 lb (4.99 kg)   Body Composition  Body Fat %: 43.2 % Fat Mass (lbs): 83 lbs Muscle Mass (lbs): 103.4 lbs Total Body Water (lbs): 72.4 lbs Visceral Fat Rating : 13   Other Clinical Data Fasting: No Labs: No Today's Visit #: 3 Starting Date: 11/27/22     HPI  Chief Complaint: OBESITY  Deanna Hale is here to discuss her progress with her obesity treatment plan. She is on the the Category 2 Plan and states she is following her eating plan approximately 100 % of the time. She states she is exercising 30 minutes 2 days per week.   Interval History:  Since last office visit she has lost 5 pounds.  She went on vacation since her last visit.  She is averaging around 1100 calories and 70+ grams of protein daily.  She is substituting a protein shake for breakfast or avocado toast.  Lunch, avocado toast or [place] wrap. Dinner:  vegetables or protein shake. Snacks on cheese, eggs, yogurt, almonds. She is not skipping meals.  She is drinking coffee, tea, water.  Denies sugary drinks.  Denies polyphagia or cravings.     Since starting here she overall notes she feels better, more energy, no pain and her migraines have decreased.     Pharmacotherapy for weight loss: She is not currently taking medications  for medical weight loss.    Previous pharmacotherapy for medical weight loss:  none  Bariatric surgery:  Patient has not had bariatric surgery.    Hyperlipidemia Medication(s): none Cardiovascular risk factors: advanced age (older than 24 for men, 71 for women),  dyslipidemia, and obesity (BMI >= 30 kg/m2)  Lab Results  Component Value Date   CHOL 253 (H) 11/27/2022   HDL 85 11/27/2022   LDLCALC 151 (H) 11/27/2022   TRIG 98 11/27/2022   Lab Results  Component Value Date   ALT 17 11/27/2022   AST 21 11/27/2022   ALKPHOS 107 11/27/2022   BILITOT 0.4 11/27/2022   The 10-year ASCVD risk score (Arnett DK, et al., 2019) is: 7.3%   Values used to calculate the score:     Age: 67 years     Sex: Female     Is Non-Hispanic African American: No     Diabetic: No     Tobacco smoker: No     Systolic Blood Pressure: 122 mmHg     Is BP treated: No     HDL Cholesterol: 80 mg/dL     Total Cholesterol: 216 mg/dL   Tachycardia Pulse has been elevated on last 2 visits.  Patient reports tachycardia off and on for years.  Has seen cardiology in the past-last visit 02/08/2019.  Denies chest pain.  History of holter monitor with PVCs.  Last EKG was 11/27/22.  Stress test 02/2019  PHYSICAL EXAM:  Blood pressure 122/83, pulse (!) 106, temperature 98.1 F (36.7 C), height 5\' 4"  (1.626 m), weight 192 lb (87.1 kg), SpO2 98%. Body mass index is 32.96 kg/m.  General: She is overweight, cooperative, alert, well developed, and in no acute  distress. PSYCH: Has normal mood, affect and thought process.   Extremities: No edema.  Neurologic: No gross sensory or motor deficits. No tremors or fasciculations noted.    DIAGNOSTIC DATA REVIEWED:  BMET    Component Value Date/Time   NA 141 11/27/2022 1125   K 4.8 11/27/2022 1125   CL 103 11/27/2022 1125   CO2 25 11/27/2022 1125   GLUCOSE 97 11/27/2022 1125   BUN 13 11/27/2022 1125   CREATININE 0.99 11/27/2022 1125   CALCIUM 9.8 11/27/2022 1125   Lab Results  Component Value Date   HGBA1C 5.6 11/27/2022   Lab Results  Component Value Date   INSULIN 7.7 11/27/2022   Lab Results  Component Value Date   TSH 1.370 11/27/2022   CBC    Component Value Date/Time   WBC 6.9 11/27/2022 1125   WBC 8.2 03/16/2007  1646   RBC 5.21 11/27/2022 1125   RBC 4.82 03/16/2007 1646   HGB 15.2 11/27/2022 1125   HCT 46.0 11/27/2022 1125   PLT 281 11/27/2022 1125   MCV 88 11/27/2022 1125   MCH 29.2 11/27/2022 1125   MCHC 33.0 11/27/2022 1125   MCHC 34.4 03/16/2007 1646   RDW 13.0 11/27/2022 1125   Iron Studies No results found for: "IRON", "TIBC", "FERRITIN", "IRONPCTSAT" Lipid Panel     Component Value Date/Time   CHOL 253 (H) 11/27/2022 1125   TRIG 98 11/27/2022 1125   HDL 85 11/27/2022 1125   LDLCALC 151 (H) 11/27/2022 1125   Hepatic Function Panel     Component Value Date/Time   PROT 7.0 11/27/2022 1125   ALBUMIN 4.7 11/27/2022 1125   AST 21 11/27/2022 1125   ALT 17 11/27/2022 1125   ALKPHOS 107 11/27/2022 1125   BILITOT 0.4 11/27/2022 1125      Component Value Date/Time   TSH 1.370 11/27/2022 1125   Nutritional Lab Results  Component Value Date   VD25OH 32.6 11/27/2022     ASSESSMENT AND PLAN  TREATMENT PLAN FOR OBESITY:  Recommended Dietary Goals  Deanna Hale is currently in the action stage of change. As such, her goal is to continue weight management plan. She has agreed to the Category 2 Plan.  Behavioral Intervention  We discussed the following Behavioral Modification Strategies today: increasing lean protein intake, decreasing simple carbohydrates , increasing vegetables, increasing lower glycemic fruits, avoiding skipping meals, increasing water intake, continue to practice mindfulness when eating, and planning for success.  Additional resources provided today: NA  Recommended Physical Activity Goals  Deanna Hale has been advised to work up to 150 minutes of moderate intensity aerobic activity a week and strengthening exercises 2-3 times per week for cardiovascular health, weight loss maintenance and preservation of muscle mass.   She has agreed to Think about ways to increase daily physical activity and overcoming barriers to exercise and Increase physical activity in their  day and reduce sedentary time (increase NEAT).    ASSOCIATED CONDITIONS ADDRESSED TODAY  Action/Plan  Elevated cholesterol Cardiovascular risk and specific lipid/LDL goals reviewed.  We discussed several lifestyle modifications today and Deanna Hale will continue to work on diet, exercise and weight loss efforts. Orders and follow up as documented in patient record.   Counseling Intensive lifestyle modifications are the first line treatment for this issue. Dietary changes: Increase soluble fiber. Decrease simple carbohydrates. Exercise changes: Moderate to vigorous-intensity aerobic activity 150 minutes per week if tolerated. Lipid-lowering medications: see documented in medical record.   To follow up with PCP to discuss plan of  care-next appt is 04/03/23.   Tachycardia Discussed with patient today.  Keep follow up appt with PCP  Generalized obesity  BMI 32.0-32.9,adult         Return in about 3 weeks (around 01/20/2023).Marland Kitchen She was informed of the importance of frequent follow up visits to maximize her success with intensive lifestyle modifications for her multiple health conditions.   ATTESTASTION STATEMENTS:  Reviewed by clinician on day of visit: allergies, medications, problem list, medical history, surgical history, family history, social history, and previous encounter notes.   Time spent on visit including pre-visit chart review and post-visit care and charting was 30 minutes.    Theodis Sato. Redmond Whittley FNP-C

## 2023-01-22 ENCOUNTER — Ambulatory Visit: Payer: PPO | Admitting: Nurse Practitioner

## 2023-01-29 ENCOUNTER — Encounter: Payer: Self-pay | Admitting: Nurse Practitioner

## 2023-01-29 ENCOUNTER — Ambulatory Visit (INDEPENDENT_AMBULATORY_CARE_PROVIDER_SITE_OTHER): Payer: PPO | Admitting: Nurse Practitioner

## 2023-01-29 VITALS — BP 133/85 | HR 89 | Temp 98.1°F | Ht 64.0 in | Wt 185.0 lb

## 2023-01-29 DIAGNOSIS — R000 Tachycardia, unspecified: Secondary | ICD-10-CM | POA: Diagnosis not present

## 2023-01-29 DIAGNOSIS — Z6831 Body mass index (BMI) 31.0-31.9, adult: Secondary | ICD-10-CM | POA: Diagnosis not present

## 2023-01-29 DIAGNOSIS — E65 Localized adiposity: Secondary | ICD-10-CM

## 2023-01-29 DIAGNOSIS — E669 Obesity, unspecified: Secondary | ICD-10-CM | POA: Diagnosis not present

## 2023-01-29 NOTE — Patient Instructions (Signed)

## 2023-01-29 NOTE — Progress Notes (Signed)
Office: [phone]  /  Fax: [phone]  WEIGHT SUMMARY AND BIOMETRICS  Weight Lost Since Last Visit: 7lb  Weight Gained Since Last Visit: 0lb   Vitals Temp: 98.1 F (36.7 C) BP: 133/85 Pulse Rate: 89 SpO2: 98 %   Anthropometric Measurements Height: 5\' 4"  (1.626 m) Weight: 185 lb (83.9 kg) BMI (Calculated): 31.74 Weight at Last Visit: 192lb Weight Lost Since Last Visit: 7lb Weight Gained Since Last Visit: 0lb Starting Weight: 203lb Total Weight Loss (lbs): 18 lb (8.165 kg)   Body Composition  Body Fat %: 41.4 % Fat Mass (lbs): 76.8 lbs Muscle Mass (lbs): 103 lbs Total Body Water (lbs): 71.4 lbs Visceral Fat Rating : 12   Other Clinical Data Fasting: No Labs: No Today's Visit #: 4 Starting Date: 11/27/22     HPI  Chief Complaint: OBESITY  Deanna Hale is here to discuss her progress with her obesity treatment plan. She is on the the Category 2 Plan and states she is following her eating plan approximately 100 % of the time. She states she is exercising 60 minutes 2 days per week.   Interval History:  Since last office visit she has lost 7 pounds.  She has overall done well with weight loss.   She started piliates since her last visit.     Pharmacotherapy for weight loss: She is not currently taking medications  for medical weight loss.     Previous pharmacotherapy for medical weight loss:  None  Bariatric surgery:  She has not had bariatric surgery.     Visceral obesity Improving.  Today rating was 12.    Tachycardia Rate today was 89.  Will continue to monitor.  Denies chest pain.    PHYSICAL EXAM:  Blood pressure 133/85, pulse 89, temperature 98.1 F (36.7 C), height 5\' 4"  (1.626 m), weight 185 lb (83.9 kg), SpO2 98%. Body mass index is 31.76 kg/m.  General: She is overweight, cooperative, alert, well developed, and in no acute distress. PSYCH: Has normal mood, affect and thought process.   Extremities: No edema.  Neurologic: No gross  sensory or motor deficits. No tremors or fasciculations noted.    DIAGNOSTIC DATA REVIEWED:  BMET    Component Value Date/Time   NA 141 11/27/2022 1125   K 4.8 11/27/2022 1125   CL 103 11/27/2022 1125   CO2 25 11/27/2022 1125   GLUCOSE 97 11/27/2022 1125   BUN 13 11/27/2022 1125   CREATININE 0.99 11/27/2022 1125   CALCIUM 9.8 11/27/2022 1125   Lab Results  Component Value Date   HGBA1C 5.6 11/27/2022   Lab Results  Component Value Date   INSULIN 7.7 11/27/2022   Lab Results  Component Value Date   TSH 1.370 11/27/2022   CBC    Component Value Date/Time   WBC 6.9 11/27/2022 1125   WBC 8.2 03/16/2007 1646   RBC 5.21 11/27/2022 1125   RBC 4.82 03/16/2007 1646   HGB 15.2 11/27/2022 1125   HCT 46.0 11/27/2022 1125   PLT 281 11/27/2022 1125   MCV 88 11/27/2022 1125   MCH 29.2 11/27/2022 1125   MCHC 33.0 11/27/2022 1125   MCHC 34.4 03/16/2007 1646   RDW 13.0 11/27/2022 1125   Iron Studies No results found for: "IRON", "TIBC", "FERRITIN", "IRONPCTSAT" Lipid Panel     Component Value Date/Time   CHOL 253 (H) 11/27/2022 1125   TRIG 98 11/27/2022 1125   HDL 85 11/27/2022 1125   LDLCALC 151 (H) 11/27/2022 1125   Hepatic Function  Panel     Component Value Date/Time   PROT 7.0 11/27/2022 1125   ALBUMIN 4.7 11/27/2022 1125   AST 21 11/27/2022 1125   ALT 17 11/27/2022 1125   ALKPHOS 107 11/27/2022 1125   BILITOT 0.4 11/27/2022 1125      Component Value Date/Time   TSH 1.370 11/27/2022 1125   Nutritional Lab Results  Component Value Date   VD25OH 32.6 11/27/2022     ASSESSMENT AND PLAN  TREATMENT PLAN FOR OBESITY:  Recommended Dietary Goals  Deanna Hale is currently in the action stage of change. As such, her goal is to continue weight management plan. She has agreed to the Category 2 Plan.  Behavioral Intervention  We discussed the following Behavioral Modification Strategies today: increasing lean protein intake, decreasing simple carbohydrates ,  increasing vegetables, increasing lower glycemic fruits, increasing water intake, work on meal planning and preparation, reading food labels , keeping healthy foods at home, continue to practice mindfulness when eating, and planning for success.  Additional resources provided today: NA  Recommended Physical Activity Goals  Deanna Hale has been advised to work up to 150 minutes of moderate intensity aerobic activity a week and strengthening exercises 2-3 times per week for cardiovascular health, weight loss maintenance and preservation of muscle mass.   She has agreed to Continue current level of physical activity  and Increase physical activity in their day and reduce sedentary time (increase NEAT).   ASSOCIATED CONDITIONS ADDRESSED TODAY  Action/Plan  Visceral obesity Will continue to monitor  Tachycardia Continue to follow up with PCP.  Will continue to monitor  Generalized obesity  BMI 31.0-31.9,adult         Return in about 4 weeks (around 02/26/2023).Marland Kitchen She was informed of the importance of frequent follow up visits to maximize her success with intensive lifestyle modifications for her multiple health conditions.   ATTESTASTION STATEMENTS:  Reviewed by clinician on day of visit: allergies, medications, problem list, medical history, surgical history, family history, social history, and previous encounter notes.   Time spent on visit including pre-visit chart review and post-visit care and charting was 30 minutes.    Theodis Sato. Milton Sagona FNP-C

## 2023-02-26 ENCOUNTER — Ambulatory Visit (INDEPENDENT_AMBULATORY_CARE_PROVIDER_SITE_OTHER): Payer: PPO | Admitting: Nurse Practitioner

## 2023-02-26 ENCOUNTER — Encounter: Payer: Self-pay | Admitting: Nurse Practitioner

## 2023-02-26 VITALS — BP 127/83 | HR 100 | Temp 98.1°F | Ht 64.0 in | Wt 178.0 lb

## 2023-02-26 DIAGNOSIS — E669 Obesity, unspecified: Secondary | ICD-10-CM

## 2023-02-26 DIAGNOSIS — Z6830 Body mass index (BMI) 30.0-30.9, adult: Secondary | ICD-10-CM | POA: Diagnosis not present

## 2023-02-26 DIAGNOSIS — E668 Other obesity: Secondary | ICD-10-CM | POA: Diagnosis not present

## 2023-02-26 DIAGNOSIS — E65 Localized adiposity: Secondary | ICD-10-CM

## 2023-02-26 NOTE — Progress Notes (Signed)
Office: [phone]  /  Fax: [phone]  WEIGHT SUMMARY AND BIOMETRICS  Weight Lost Since Last Visit: 7lb  Weight Gained Since Last Visit: 0lb   Vitals Temp: 98.1 F (36.7 C) BP: 127/83 Pulse Rate: 100 SpO2: 98 %   Anthropometric Measurements Height: 5\' 4"  (1.626 m) Weight: 178 lb (80.7 kg) BMI (Calculated): 30.54 Weight at Last Visit: 185lb Weight Lost Since Last Visit: 7lb Weight Gained Since Last Visit: 0lb Starting Weight: 203lb Total Weight Loss (lbs): 25 lb (11.3 kg)   Body Composition  Body Fat %: 40.2 % Fat Mass (lbs): 71.8 lbs Muscle Mass (lbs): 101.2 lbs Total Body Water (lbs): 70 lbs Visceral Fat Rating : 11   Other Clinical Data Fasting: No Labs: No Today's Visit #: 5 Starting Date: 11/27/22     HPI  Chief Complaint: OBESITY  Deanna Hale is here to discuss her progress with her obesity treatment plan. She is on the the Category 2 Plan and states she is following her eating plan approximately 100 % of the time. She states she is exercising 60 minutes 2 days per week.   Interval History:  Since last office visit she has lost 7 pounds.  She is averaging around 1000 calories and 78-82 grams of protein. She has overall done with weight loss.  She is more active and is feeling excellent.  Denies polyphagia and cravings.  Her clothes are fitting better.   BF:  protein shake Snack:  none Lunch:  eggs or cheese with protein, [place], vegetables, sometimes smoothie, cottage cheese, yogurt, almond butter, whisps Snack:  see above Dinner:  protein and vegetables   Pharmacotherapy for weight loss: She is not currently taking medications  for medical weight loss.    Previous pharmacotherapy for medical weight loss:  none  Bariatric surgery:  Patient has not had bariatric surgery   Visceral obesity Improving.  Today rating was 11.   PHYSICAL EXAM:  Blood pressure 127/83, pulse 100, temperature 98.1 F (36.7 C), height 5\' 4"  (1.626 m), weight 178  lb (80.7 kg), SpO2 98%. Body mass index is 30.55 kg/m.  General: She is overweight, cooperative, alert, well developed, and in no acute distress. PSYCH: Has normal mood, affect and thought process.   Extremities: No edema.  Neurologic: No gross sensory or motor deficits. No tremors or fasciculations noted.    DIAGNOSTIC DATA REVIEWED:  BMET    Component Value Date/Time   NA 141 11/27/2022 1125   K 4.8 11/27/2022 1125   CL 103 11/27/2022 1125   CO2 25 11/27/2022 1125   GLUCOSE 97 11/27/2022 1125   BUN 13 11/27/2022 1125   CREATININE 0.99 11/27/2022 1125   CALCIUM 9.8 11/27/2022 1125   Lab Results  Component Value Date   HGBA1C 5.6 11/27/2022   Lab Results  Component Value Date   INSULIN 7.7 11/27/2022   Lab Results  Component Value Date   TSH 1.370 11/27/2022   CBC    Component Value Date/Time   WBC 6.9 11/27/2022 1125   WBC 8.2 03/16/2007 1646   RBC 5.21 11/27/2022 1125   RBC 4.82 03/16/2007 1646   HGB 15.2 11/27/2022 1125   HCT 46.0 11/27/2022 1125   PLT 281 11/27/2022 1125   MCV 88 11/27/2022 1125   MCH 29.2 11/27/2022 1125   MCHC 33.0 11/27/2022 1125   MCHC 34.4 03/16/2007 1646   RDW 13.0 11/27/2022 1125   Iron Studies No results found for: "IRON", "TIBC", "FERRITIN", "IRONPCTSAT" Lipid Panel  Component Value Date/Time   CHOL 253 (H) 11/27/2022 1125   TRIG 98 11/27/2022 1125   HDL 85 11/27/2022 1125   LDLCALC 151 (H) 11/27/2022 1125   Hepatic Function Panel     Component Value Date/Time   PROT 7.0 11/27/2022 1125   ALBUMIN 4.7 11/27/2022 1125   AST 21 11/27/2022 1125   ALT 17 11/27/2022 1125   ALKPHOS 107 11/27/2022 1125   BILITOT 0.4 11/27/2022 1125      Component Value Date/Time   TSH 1.370 11/27/2022 1125   Nutritional Lab Results  Component Value Date   VD25OH 32.6 11/27/2022     ASSESSMENT AND PLAN  TREATMENT PLAN FOR OBESITY:  Recommended Dietary Goals  Deanna Hale is currently in the action stage of change. As such, her  goal is to continue weight management plan. She has agreed to the Category 2 Plan.  Behavioral Intervention  We discussed the following Behavioral Modification Strategies today: increasing lean protein intake, decreasing simple carbohydrates , increasing vegetables, increasing lower glycemic fruits, increasing water intake, continue to practice mindfulness when eating, and planning for success.  Additional resources provided today: NA  Recommended Physical Activity Goals  Deanna Hale has been advised to work up to 150 minutes of moderate intensity aerobic activity a week and strengthening exercises 2-3 times per week for cardiovascular health, weight loss maintenance and preservation of muscle mass.   She has agreed to Continue current level of physical activity    ASSOCIATED CONDITIONS ADDRESSED TODAY  Action/Plan  Visceral obesity Improving.  Will continue to monitor  Generalized obesity  BMI 30.0-30.9,adult         Return in about 4 weeks (around 03/26/2023).Marland Kitchen She was informed of the importance of frequent follow up visits to maximize her success with intensive lifestyle modifications for her multiple health conditions.   ATTESTASTION STATEMENTS:  Reviewed by clinician on day of visit: allergies, medications, problem list, medical history, surgical history, family history, social history, and previous encounter notes.   Time spent on visit including pre-visit chart review and post-visit care and charting was 30 minutes.    Theodis Sato. Hurley Blevins FNP-C

## 2023-03-26 ENCOUNTER — Encounter: Payer: Self-pay | Admitting: Nurse Practitioner

## 2023-03-26 ENCOUNTER — Ambulatory Visit (INDEPENDENT_AMBULATORY_CARE_PROVIDER_SITE_OTHER): Payer: PPO | Admitting: Nurse Practitioner

## 2023-03-26 VITALS — BP 120/79 | HR 86 | Temp 98.0°F | Ht 64.0 in | Wt 175.0 lb

## 2023-03-26 DIAGNOSIS — E669 Obesity, unspecified: Secondary | ICD-10-CM

## 2023-03-26 DIAGNOSIS — E7800 Pure hypercholesterolemia, unspecified: Secondary | ICD-10-CM

## 2023-03-26 DIAGNOSIS — Z6830 Body mass index (BMI) 30.0-30.9, adult: Secondary | ICD-10-CM

## 2023-03-26 NOTE — Progress Notes (Signed)
Office: [phone]  /  Fax: [phone]  WEIGHT SUMMARY AND BIOMETRICS  Weight Lost Since Last Visit: 3lb  Weight Gained Since Last Visit: 0lb   Vitals Temp: 98 F (36.7 C) BP: 120/79 Pulse Rate: 86 SpO2: 100 %   Anthropometric Measurements Height: 5\' 4"  (1.626 m) Weight: 175 lb (79.4 kg) BMI (Calculated): 30.02 Weight at Last Visit: 178lb Weight Lost Since Last Visit: 3lb Weight Gained Since Last Visit: 0lb Starting Weight: 203lb Total Weight Loss (lbs): 28 lb (12.7 kg)   Body Composition  Body Fat %: 40.3 % Fat Mass (lbs): 70.6 lbs Muscle Mass (lbs): 99.2 lbs Total Body Water (lbs): 70.2 lbs Visceral Fat Rating : 11   Other Clinical Data Fasting: Yes Labs: No Today's Visit #: 6 Starting Date: 11/27/22     HPI  Chief Complaint: OBESITY  Deanna Hale is here to discuss her progress with her obesity treatment plan. She is on the the Category 2 Plan and states she is following her eating plan approximately 100 % of the time. She states she is exercising 50 minutes 2 days per week.   Interval History:  Since last office visit she has lost 3 pounds.  She has been aiming to eat more protein-dairy based protein.  She would like to find more protein options that are not dairy. Her clothes are falling off of her and she has had to buy new clothes.  She is substituting a protein shake for breakfast (30 gm protein).  She has stopped alcohol and is limiting sugar and carbs.  Her pain has overall improved with weight loss.   She is eating protein, dairy and vegetables daily.      Pharmacotherapy for weight loss: She is not currently taking medications  for medical weight loss.    Previous pharmacotherapy for medical weight loss:  None  Bariatric surgery:  Patient has not had bariatric surgery  Hyperlipidemia Medication(s): none. Denies side effects.   Cardiovascular risk factors: advanced age (older than 24 for men, 38 for women), dyslipidemia, and obesity (BMI  >= 30 kg/m2)   Lab Results  Component Value Date   CHOL 253 (H) 11/27/2022   HDL 85 11/27/2022   LDLCALC 151 (H) 11/27/2022   TRIG 98 11/27/2022   Lab Results  Component Value Date   ALT 17 11/27/2022   AST 21 11/27/2022   ALKPHOS 107 11/27/2022   BILITOT 0.4 11/27/2022   The 10-year ASCVD risk score (Arnett DK, et al., 2019) is: 7.1%   Values used to calculate the score:     Age: 67 years     Sex: Female     Is Non-Hispanic African American: No     Diabetic: No     Tobacco smoker: No     Systolic Blood Pressure: 120 mmHg     Is BP treated: No     HDL Cholesterol: 80 mg/dL     Total Cholesterol: 216 mg/dL    PHYSICAL EXAM:  Blood pressure 120/79, pulse 86, temperature 98 F (36.7 C), height 5\' 4"  (1.626 m), weight 175 lb (79.4 kg), SpO2 100%. Body mass index is 30.04 kg/m.  General: She is overweight, cooperative, alert, well developed, and in no acute distress. PSYCH: Has normal mood, affect and thought process.   Extremities: No edema.  Neurologic: No gross sensory or motor deficits. No tremors or fasciculations noted.    DIAGNOSTIC DATA REVIEWED:  BMET    Component Value Date/Time   NA 141 11/27/2022 1125  K 4.8 11/27/2022 1125   CL 103 11/27/2022 1125   CO2 25 11/27/2022 1125   GLUCOSE 97 11/27/2022 1125   BUN 13 11/27/2022 1125   CREATININE 0.99 11/27/2022 1125   CALCIUM 9.8 11/27/2022 1125   Lab Results  Component Value Date   HGBA1C 5.6 11/27/2022   Lab Results  Component Value Date   INSULIN 7.7 11/27/2022   Lab Results  Component Value Date   TSH 1.370 11/27/2022   CBC    Component Value Date/Time   WBC 6.9 11/27/2022 1125   WBC 8.2 03/16/2007 1646   RBC 5.21 11/27/2022 1125   RBC 4.82 03/16/2007 1646   HGB 15.2 11/27/2022 1125   HCT 46.0 11/27/2022 1125   PLT 281 11/27/2022 1125   MCV 88 11/27/2022 1125   MCH 29.2 11/27/2022 1125   MCHC 33.0 11/27/2022 1125   MCHC 34.4 03/16/2007 1646   RDW 13.0 11/27/2022 1125   Iron  Studies No results found for: "IRON", "TIBC", "FERRITIN", "IRONPCTSAT" Lipid Panel     Component Value Date/Time   CHOL 253 (H) 11/27/2022 1125   TRIG 98 11/27/2022 1125   HDL 85 11/27/2022 1125   LDLCALC 151 (H) 11/27/2022 1125   Hepatic Function Panel     Component Value Date/Time   PROT 7.0 11/27/2022 1125   ALBUMIN 4.7 11/27/2022 1125   AST 21 11/27/2022 1125   ALT 17 11/27/2022 1125   ALKPHOS 107 11/27/2022 1125   BILITOT 0.4 11/27/2022 1125      Component Value Date/Time   TSH 1.370 11/27/2022 1125   Nutritional Lab Results  Component Value Date   VD25OH 32.6 11/27/2022     ASSESSMENT AND PLAN  TREATMENT PLAN FOR OBESITY:  Recommended Dietary Goals  Deanna Hale is currently in the action stage of change. As such, her goal is to continue weight management plan. She has agreed to the Category 2 Plan. Today we discussed extensively the importance of meeting calories and protein goals.  Not limiting calories and preserving muscle.    Behavioral Intervention  We discussed the following Behavioral Modification Strategies today: increasing lean protein intake to established goals, increasing vegetables, increasing fiber rich foods, avoiding skipping meals, increasing water intake , and continue to work on maintaining a reduced calorie state, getting the recommended amount of protein, incorporating whole foods, making healthy choices, staying well hydrated and practicing mindfulness when eating..  Additional resources provided today: NA  Recommended Physical Activity Goals  Deanna Hale has been advised to work up to 150 minutes of moderate intensity aerobic activity a week and strengthening exercises 2-3 times per week for cardiovascular health, weight loss maintenance and preservation of muscle mass.   She has agreed to Continue current level of physical activity , Think about enjoyable ways to increase daily physical activity and overcoming barriers to exercise, and Increase  physical activity in their day and reduce sedentary time (increase NEAT).   Encouraged resistance training.   ASSOCIATED CONDITIONS ADDRESSED TODAY  Action/Plan  Elevated cholesterol Cardiovascular risk and specific lipid/LDL goals reviewed.  We discussed several lifestyle modifications today and Deanna Hale will continue to work on diet, exercise and weight loss efforts. Orders and follow up as documented in patient record.   Counseling Intensive lifestyle modifications are the first line treatment for this issue. Dietary changes: Increase soluble fiber. Decrease simple carbohydrates. Exercise changes: Moderate to vigorous-intensity aerobic activity 150 minutes per week if tolerated. Lipid-lowering medications: see documented in medical record.   Generalized obesity  BMI 30.0-30.9,adult  Return in about 4 weeks (around 04/23/2023).Marland Kitchen She was informed of the importance of frequent follow up visits to maximize her success with intensive lifestyle modifications for her multiple health conditions.   ATTESTASTION STATEMENTS:  Reviewed by clinician on day of visit: allergies, medications, problem list, medical history, surgical history, family history, social history, and previous encounter notes.   Time spent on visit including pre-visit chart review and post-visit care and charting was 30 minutes.    Theodis Sato. Deanna Swiss FNP-C

## 2023-04-23 ENCOUNTER — Ambulatory Visit: Payer: PPO | Admitting: Nurse Practitioner

## 2023-04-29 ENCOUNTER — Ambulatory Visit (INDEPENDENT_AMBULATORY_CARE_PROVIDER_SITE_OTHER): Payer: PPO | Admitting: Nurse Practitioner

## 2023-04-29 ENCOUNTER — Encounter: Payer: Self-pay | Admitting: Nurse Practitioner

## 2023-04-29 VITALS — BP 110/77 | HR 97 | Temp 97.9°F | Ht 64.0 in | Wt 167.0 lb

## 2023-04-29 DIAGNOSIS — Z6828 Body mass index (BMI) 28.0-28.9, adult: Secondary | ICD-10-CM | POA: Diagnosis not present

## 2023-04-29 DIAGNOSIS — E669 Obesity, unspecified: Secondary | ICD-10-CM

## 2023-04-29 DIAGNOSIS — M810 Age-related osteoporosis without current pathological fracture: Secondary | ICD-10-CM | POA: Diagnosis not present

## 2023-04-29 NOTE — Progress Notes (Signed)
Office: [phone]  /  Fax: [phone]  WEIGHT SUMMARY AND BIOMETRICS  Weight Lost Since Last Visit: 8lb  Weight Gained Since Last Visit: 0lb   Vitals Temp: 97.9 F (36.6 C) BP: 110/77 Pulse Rate: 97 SpO2: 98 %   Anthropometric Measurements Height: 5\' 4"  (1.626 m) Weight: 167 lb (75.8 kg) BMI (Calculated): 28.65 Weight at Last Visit: 175lb Weight Lost Since Last Visit: 8lb Weight Gained Since Last Visit: 0lb Starting Weight: 203lb Total Weight Loss (lbs): 36 lb (16.3 kg)   Body Composition  Body Fat %: 38.7 % Fat Mass (lbs): 65 lbs Muscle Mass (lbs): 97.6 lbs Total Body Water (lbs): 68.2 lbs Visceral Fat Rating : 11   Other Clinical Data Fasting: Yes Labs: No Today's Visit #: 7 Starting Date: 11/27/22     HPI  Chief Complaint: OBESITY  Katarina is here to discuss her progress with her obesity treatment plan. She is on the the Category 2 Plan and states she is following her eating plan approximately 100 % of the time. She states she is exercising 50 minutes 4-5 days per week.   Interval History:  Since last office visit she has lost 8 pounds.  She has overall done well with weight loss and is pleased with how she is doing.  Doing well with the meal plan and has no complaints today.    Pharmacotherapy for weight loss: She is not currently taking medications  for medical weight loss.     Previous pharmacotherapy for medical weight loss:  None   Bariatric surgery:  Patient has not had bariatric surgery  Osteoporosis She is taking MVI and calcium 1200 daily.  She doesn't want to start Fosamax or be referred to an osteoporosis specialist.     PHYSICAL EXAM:  Blood pressure 110/77, pulse 97, temperature 97.9 F (36.6 C), height 5\' 4"  (1.626 m), weight 167 lb (75.8 kg), SpO2 98%. Body mass index is 28.67 kg/m.  General: She is overweight, cooperative, alert, well developed, and in no acute distress. PSYCH: Has normal mood, affect and thought  process.   Extremities: No edema.  Neurologic: No gross sensory or motor deficits. No tremors or fasciculations noted.    DIAGNOSTIC DATA REVIEWED:  BMET    Component Value Date/Time   NA 141 11/27/2022 1125   K 4.8 11/27/2022 1125   CL 103 11/27/2022 1125   CO2 25 11/27/2022 1125   GLUCOSE 97 11/27/2022 1125   BUN 13 11/27/2022 1125   CREATININE 0.99 11/27/2022 1125   CALCIUM 9.8 11/27/2022 1125   Lab Results  Component Value Date   HGBA1C 5.6 11/27/2022   Lab Results  Component Value Date   INSULIN 7.7 11/27/2022   Lab Results  Component Value Date   TSH 1.370 11/27/2022   CBC    Component Value Date/Time   WBC 6.9 11/27/2022 1125   WBC 8.2 03/16/2007 1646   RBC 5.21 11/27/2022 1125   RBC 4.82 03/16/2007 1646   HGB 15.2 11/27/2022 1125   HCT 46.0 11/27/2022 1125   PLT 281 11/27/2022 1125   MCV 88 11/27/2022 1125   MCH 29.2 11/27/2022 1125   MCHC 33.0 11/27/2022 1125   MCHC 34.4 03/16/2007 1646   RDW 13.0 11/27/2022 1125   Iron Studies No results found for: "IRON", "TIBC", "FERRITIN", "IRONPCTSAT" Lipid Panel     Component Value Date/Time   CHOL 253 (H) 11/27/2022 1125   TRIG 98 11/27/2022 1125   HDL 85 11/27/2022 1125   LDLCALC 151 (  H) 11/27/2022 1125   Hepatic Function Panel     Component Value Date/Time   PROT 7.0 11/27/2022 1125   ALBUMIN 4.7 11/27/2022 1125   AST 21 11/27/2022 1125   ALT 17 11/27/2022 1125   ALKPHOS 107 11/27/2022 1125   BILITOT 0.4 11/27/2022 1125      Component Value Date/Time   TSH 1.370 11/27/2022 1125   Nutritional Lab Results  Component Value Date   VD25OH 32.6 11/27/2022     ASSESSMENT AND PLAN  TREATMENT PLAN FOR OBESITY:  Recommended Dietary Goals  Shawnae is currently in the action stage of change. As such, her goal is to continue weight management plan. She has agreed to the Category 2 Plan.  Behavioral Intervention  We discussed the following Behavioral Modification Strategies today: continue to  work on maintaining a reduced calorie state, getting the recommended amount of protein, incorporating whole foods, making healthy choices, staying well hydrated and practicing mindfulness when eating..  Additional resources provided today: NA  Recommended Physical Activity Goals  Zannah has been advised to work up to 150 minutes of moderate intensity aerobic activity a week and strengthening exercises 2-3 times per week for cardiovascular health, weight loss maintenance and preservation of muscle mass.   She has agreed to Continue current level of physical activity , Think about enjoyable ways to increase daily physical activity and overcoming barriers to exercise, Increase physical activity in their day and reduce sedentary time (increase NEAT)., and Increase the intensity, frequency or duration of strengthening exercises     ASSOCIATED CONDITIONS ADDRESSED TODAY  Action/Plan  Osteoporosis, unspecified osteoporosis type, unspecified pathological fracture presence Continue to follow up with PCP.    Generalized obesity  BMI 28.0-28.9,adult         Return in about 2 months (around 06/29/2023).Marland Kitchen She was informed of the importance of frequent follow up visits to maximize her success with intensive lifestyle modifications for her multiple health conditions.   ATTESTASTION STATEMENTS:  Reviewed by clinician on day of visit: allergies, medications, problem list, medical history, surgical history, family history, social history, and previous encounter notes.   Time spent on visit including pre-visit chart review and post-visit care and charting was 30 minutes.    Theodis Sato. Shamere Dilworth FNP-C

## 2023-06-26 ENCOUNTER — Ambulatory Visit: Payer: PPO | Admitting: Nurse Practitioner

## 2023-06-26 ENCOUNTER — Encounter: Payer: Self-pay | Admitting: Nurse Practitioner

## 2023-06-26 VITALS — BP 134/77 | HR 99 | Temp 98.3°F | Ht 64.0 in | Wt 162.0 lb

## 2023-06-26 DIAGNOSIS — E65 Localized adiposity: Secondary | ICD-10-CM

## 2023-06-26 DIAGNOSIS — E669 Obesity, unspecified: Secondary | ICD-10-CM | POA: Diagnosis not present

## 2023-06-26 DIAGNOSIS — Z6827 Body mass index (BMI) 27.0-27.9, adult: Secondary | ICD-10-CM | POA: Diagnosis not present

## 2023-06-26 NOTE — Progress Notes (Signed)
Office: [phone]  /  Fax: [phone]  WEIGHT SUMMARY AND BIOMETRICS  Weight Lost Since Last Visit: 5lb  Weight Gained Since Last Visit: 0lb   Vitals Temp: 98.3 F (36.8 C) BP: 134/77 Pulse Rate: 99 SpO2: 97 %   Anthropometric Measurements Height: 5\' 4"  (1.626 m) Weight: 162 lb (73.5 kg) BMI (Calculated): 27.79 Weight at Last Visit: 167lb Weight Lost Since Last Visit: 5lb Weight Gained Since Last Visit: 0lb Starting Weight: 203lb Total Weight Loss (lbs): 41 lb (18.6 kg)   Body Composition  Body Fat %: 37.1 % Fat Mass (lbs): 60.2 lbs Muscle Mass (lbs): 96.6 lbs Total Body Water (lbs): 67.8 lbs Visceral Fat Rating : 10   Other Clinical Data Fasting: Yes Labs: No Today's Visit #: 8 Starting Date: 11/27/22     HPI  Chief Complaint: OBESITY  Deanna Hale is here to discuss her progress with her obesity treatment plan. She is on the the Category 2 Plan and states she is following her eating plan approximately 80 % of the time. She states she is exercising 30-50 minutes 7 days per week.   Interval History:  Since last office visit she has lost 5 pounds.  She is eating small meals multiple times per day. She feels that she is meeting her protein goals.  She is making healthier choices and watching her portion sizes.  She is eating cottage cheese, [place], yogurt, eggs, veggie burgers, string cheese, protein peanut butter cups.  She snacks on cucumbers, tomatoes and [employer] chips.  She stopped salting her food.   She is drinking coffee, water, sugar free juice with collagen and a protein shake daily.   Her highest weight was 209 lbs Goal weight:  158 lbs  Pharmacotherapy for weight loss: She is not currently taking medications  for medical weight loss.     Previous pharmacotherapy for medical weight loss:  None   Bariatric surgery:  Patient has not had bariatric surgery  Visceral obesity Has improved from 14 to 10 today  PHYSICAL EXAM:  Blood pressure  134/77, pulse 99, temperature 98.3 F (36.8 C), height 5\' 4"  (1.626 m), weight 162 lb (73.5 kg), SpO2 97%. Body mass index is 27.81 kg/m.  General: She is overweight, cooperative, alert, well developed, and in no acute distress. PSYCH: Has normal mood, affect and thought process.   Extremities: No edema.  Neurologic: No gross sensory or motor deficits. No tremors or fasciculations noted.    DIAGNOSTIC DATA REVIEWED:  BMET    Component Value Date/Time   NA 141 11/27/2022 1125   K 4.8 11/27/2022 1125   CL 103 11/27/2022 1125   CO2 25 11/27/2022 1125   GLUCOSE 97 11/27/2022 1125   BUN 13 11/27/2022 1125   CREATININE 0.99 11/27/2022 1125   CALCIUM 9.8 11/27/2022 1125   Lab Results  Component Value Date   HGBA1C 5.6 11/27/2022   Lab Results  Component Value Date   INSULIN 7.7 11/27/2022   Lab Results  Component Value Date   TSH 1.370 11/27/2022   CBC    Component Value Date/Time   WBC 6.9 11/27/2022 1125   WBC 8.2 03/16/2007 1646   RBC 5.21 11/27/2022 1125   RBC 4.82 03/16/2007 1646   HGB 15.2 11/27/2022 1125   HCT 46.0 11/27/2022 1125   PLT 281 11/27/2022 1125   MCV 88 11/27/2022 1125   MCH 29.2 11/27/2022 1125   MCHC 33.0 11/27/2022 1125   MCHC 34.4 03/16/2007 1646   RDW 13.0 11/27/2022 1125  Iron Studies No results found for: "IRON", "TIBC", "FERRITIN", "IRONPCTSAT" Lipid Panel     Component Value Date/Time   CHOL 253 (H) 11/27/2022 1125   TRIG 98 11/27/2022 1125   HDL 85 11/27/2022 1125   LDLCALC 151 (H) 11/27/2022 1125   Hepatic Function Panel     Component Value Date/Time   PROT 7.0 11/27/2022 1125   ALBUMIN 4.7 11/27/2022 1125   AST 21 11/27/2022 1125   ALT 17 11/27/2022 1125   ALKPHOS 107 11/27/2022 1125   BILITOT 0.4 11/27/2022 1125      Component Value Date/Time   TSH 1.370 11/27/2022 1125   Nutritional Lab Results  Component Value Date   VD25OH 32.6 11/27/2022     ASSESSMENT AND PLAN  TREATMENT PLAN FOR  OBESITY:  Recommended Dietary Goals  Deanna Hale is currently in the action stage of change. As such, her goal is to continue weight management plan. She has agreed to the Category 2 Plan.  Behavioral Intervention  We discussed the following Behavioral Modification Strategies today: increasing lean protein intake to established goals, increasing vegetables, increasing fiber rich foods, increasing water intake , work on meal planning and preparation, reading food labels , keeping healthy foods at home, and continue to work on maintaining a reduced calorie state, getting the recommended amount of protein, incorporating whole foods, making healthy choices, staying well hydrated and practicing mindfulness when eating..  Additional resources provided today: NA  Recommended Physical Activity Goals  Deanna Hale has been advised to work up to 150 minutes of moderate intensity aerobic activity a week and strengthening exercises 2-3 times per week for cardiovascular health, weight loss maintenance and preservation of muscle mass.   She has agreed to Continue current level of physical activity    ASSOCIATED CONDITIONS ADDRESSED TODAY  Action/Plan  Visceral obesity Improved, will continue to monitor.    Generalized obesity  BMI 27.0-27.9,adult         Return in about 4 weeks (around 07/24/2023).Marland Kitchen She was informed of the importance of frequent follow up visits to maximize her success with intensive lifestyle modifications for her multiple health conditions.   ATTESTASTION STATEMENTS:  Reviewed by clinician on day of visit: allergies, medications, problem list, medical history, surgical history, family history, social history, and previous encounter notes.   Time spent on visit including pre-visit chart review and post-visit care and charting was 30 minutes.    Theodis Sato. Latima Hamza FNP-C

## 2023-08-25 ENCOUNTER — Ambulatory Visit (INDEPENDENT_AMBULATORY_CARE_PROVIDER_SITE_OTHER): Payer: PPO | Admitting: Nurse Practitioner

## 2023-08-25 ENCOUNTER — Encounter: Payer: Self-pay | Admitting: Nurse Practitioner

## 2023-08-25 VITALS — BP 119/81 | HR 81 | Temp 98.3°F | Ht 64.0 in | Wt 161.0 lb

## 2023-08-25 DIAGNOSIS — Z6827 Body mass index (BMI) 27.0-27.9, adult: Secondary | ICD-10-CM | POA: Diagnosis not present

## 2023-08-25 DIAGNOSIS — E669 Obesity, unspecified: Secondary | ICD-10-CM | POA: Diagnosis not present

## 2023-08-25 DIAGNOSIS — E7800 Pure hypercholesterolemia, unspecified: Secondary | ICD-10-CM | POA: Diagnosis not present

## 2023-08-25 NOTE — Progress Notes (Signed)
 Office: [phone]  /  Fax: [phone]  WEIGHT SUMMARY AND BIOMETRICS  Weight Lost Since Last Visit: 1lb  Weight Gained Since Last Visit: 0lb   Vitals Temp: 98.3 F (36.8 C) BP: 119/81 Pulse Rate: 81 SpO2: 100 %   Anthropometric Measurements Height: 5\' 4"  (1.626 m) Weight: 161 lb (73 kg) BMI (Calculated): 27.62 Weight at Last Visit: 162lb Weight Lost Since Last Visit: 1lb Weight Gained Since Last Visit: 0lb Starting Weight: 203lb Total Weight Loss (lbs): 42 lb (19.1 kg)   Body Composition  Body Fat %: 37.4 % Fat Mass (lbs): 60.2 lbs Muscle Mass (lbs): 95.6 lbs Total Body Water (lbs): 67.4 lbs Visceral Fat Rating : 10   Other Clinical Data Fasting: Yes Labs: No Today's Visit #: 9 Starting Date: 11/26/22     HPI  Chief Complaint: OBESITY  Deanna Hale is here to discuss her progress with her obesity treatment plan. She is on the the Category 2 Plan and states she is following her eating plan approximately 50 % of the time. She states she is exercising 60 minutes 2-4 days per week.   Interval History:  Since last office visit she has lost 1 pound.  She has overall done well with weight loss.  She has added some carbs back since her last visit.  She has been mindful of what she is eating.  She is making healthier choices. She is averaging around 80-90 grams of protein. She is snacking on cottage cheese, yogurt, cheese and [employer] chips.  She is drinking water with cranberry juice with collagen and a protein shake daily.    Pharmacotherapy for weight loss: She is not currently taking medications  for medical weight loss.     Previous pharmacotherapy for medical weight loss:  None   Bariatric surgery:  Patient has not had bariatric surgery  Hyperlipidemia Medication(s): none.  Last lipids were on 04/03/23-see in care everywhere Cardiovascular risk factors: advanced age (older than 53 for men, 47 for women) and dyslipidemia   Lab Results  Component Value  Date   CHOL 253 (H) 11/27/2022   HDL 85 11/27/2022   LDLCALC 151 (H) 11/27/2022   TRIG 98 11/27/2022   Lab Results  Component Value Date   ALT 17 11/27/2022   AST 21 11/27/2022   ALKPHOS 107 11/27/2022   BILITOT 0.4 11/27/2022   The 10-year ASCVD risk score (Arnett DK, et al., 2019) is: 7.3%   Values used to calculate the score:     Age: 68 years     Sex: Female     Is Non-Hispanic African American: No     Diabetic: No     Tobacco smoker: No     Systolic Blood Pressure: 119 mmHg     Is BP treated: No     HDL Cholesterol: 68 mg/dL     Total Cholesterol: 223 mg/dL    PHYSICAL EXAM:  Blood pressure 119/81, pulse 81, temperature 98.3 F (36.8 C), height 5\' 4"  (1.626 m), weight 161 lb (73 kg), SpO2 100%. Body mass index is 27.64 kg/m.  General: She is overweight, cooperative, alert, well developed, and in no acute distress. PSYCH: Has normal mood, affect and thought process.   Extremities: No edema.  Neurologic: No gross sensory or motor deficits. No tremors or fasciculations noted.    DIAGNOSTIC DATA REVIEWED:  BMET    Component Value Date/Time   NA 141 11/27/2022 1125   K 4.8 11/27/2022 1125   CL 103 11/27/2022 1125  CO2 25 11/27/2022 1125   GLUCOSE 97 11/27/2022 1125   BUN 13 11/27/2022 1125   CREATININE 0.99 11/27/2022 1125   CALCIUM 9.8 11/27/2022 1125   Lab Results  Component Value Date   HGBA1C 5.6 11/27/2022   Lab Results  Component Value Date   INSULIN 7.7 11/27/2022   Lab Results  Component Value Date   TSH 1.370 11/27/2022   CBC    Component Value Date/Time   WBC 6.9 11/27/2022 1125   WBC 8.2 03/16/2007 1646   RBC 5.21 11/27/2022 1125   RBC 4.82 03/16/2007 1646   HGB 15.2 11/27/2022 1125   HCT 46.0 11/27/2022 1125   PLT 281 11/27/2022 1125   MCV 88 11/27/2022 1125   MCH 29.2 11/27/2022 1125   MCHC 33.0 11/27/2022 1125   MCHC 34.4 03/16/2007 1646   RDW 13.0 11/27/2022 1125   Iron Studies No results found for: "IRON", "TIBC",  "FERRITIN", "IRONPCTSAT" Lipid Panel     Component Value Date/Time   CHOL 253 (H) 11/27/2022 1125   TRIG 98 11/27/2022 1125   HDL 85 11/27/2022 1125   LDLCALC 151 (H) 11/27/2022 1125   Hepatic Function Panel     Component Value Date/Time   PROT 7.0 11/27/2022 1125   ALBUMIN 4.7 11/27/2022 1125   AST 21 11/27/2022 1125   ALT 17 11/27/2022 1125   ALKPHOS 107 11/27/2022 1125   BILITOT 0.4 11/27/2022 1125      Component Value Date/Time   TSH 1.370 11/27/2022 1125   Nutritional Lab Results  Component Value Date   VD25OH 32.6 11/27/2022     ASSESSMENT AND PLAN  TREATMENT PLAN FOR OBESITY:  Recommended Dietary Goals  Deanna Hale has done well with weight loss and is in maintenance phase.    Behavioral Intervention  We discussed the following Behavioral Modification Strategies today: increasing lean protein intake to established goals, decreasing simple carbohydrates , increasing vegetables, increasing water intake , reading food labels , keeping healthy foods at home, and continue to work on maintaining a reduced calorie state, getting the recommended amount of protein, incorporating whole foods, making healthy choices, staying well hydrated and practicing mindfulness when eating..  Additional resources provided today: NA  Recommended Physical Activity Goals  Deanna Hale has been advised to work up to 150 minutes of moderate intensity aerobic activity a week and strengthening exercises 2-3 times per week for cardiovascular health, weight loss maintenance and preservation of muscle mass.   She has agreed to Think about enjoyable ways to increase daily physical activity and overcoming barriers to exercise, Increase physical activity in their day and reduce sedentary time (increase NEAT)., Increase the intensity, frequency or duration of strengthening exercises , and Increase the intensity, frequency or duration of aerobic exercises     ASSOCIATED CONDITIONS ADDRESSED  TODAY  Action/Plan  Elevated cholesterol Will recheck labs at next visit  Generalized obesity  BMI 27.0-27.9,adult      Will repeat labs at next visit   Return in about 2 months (around 10/25/2023).Marland Kitchen She was informed of the importance of frequent follow up visits to maximize her success with intensive lifestyle modifications for her multiple health conditions.   ATTESTASTION STATEMENTS:  Reviewed by clinician on day of visit: allergies, medications, problem list, medical history, surgical history, family history, social history, and previous encounter notes.   Time spent on visit including pre-visit chart review and post-visit care and charting was 30 minutes.    Theodis Sato. Briston Lax FNP-C

## 2023-10-28 ENCOUNTER — Encounter: Payer: Self-pay | Admitting: Nurse Practitioner

## 2023-10-28 ENCOUNTER — Ambulatory Visit (INDEPENDENT_AMBULATORY_CARE_PROVIDER_SITE_OTHER): Admitting: Nurse Practitioner

## 2023-10-28 VITALS — BP 127/86 | HR 83 | Temp 97.9°F | Ht 64.0 in | Wt 163.0 lb

## 2023-10-28 DIAGNOSIS — R5383 Other fatigue: Secondary | ICD-10-CM

## 2023-10-28 DIAGNOSIS — E559 Vitamin D deficiency, unspecified: Secondary | ICD-10-CM

## 2023-10-28 DIAGNOSIS — E7800 Pure hypercholesterolemia, unspecified: Secondary | ICD-10-CM

## 2023-10-28 DIAGNOSIS — R7989 Other specified abnormal findings of blood chemistry: Secondary | ICD-10-CM | POA: Diagnosis not present

## 2023-10-28 DIAGNOSIS — Z79899 Other long term (current) drug therapy: Secondary | ICD-10-CM

## 2023-10-28 DIAGNOSIS — Z6827 Body mass index (BMI) 27.0-27.9, adult: Secondary | ICD-10-CM

## 2023-10-28 DIAGNOSIS — E669 Obesity, unspecified: Secondary | ICD-10-CM

## 2023-10-28 NOTE — Progress Notes (Signed)
 Office: [phone]  /  Fax: [phone]  WEIGHT SUMMARY AND BIOMETRICS  Weight Lost Since Last Visit: 0lb  Weight Gained Since Last Visit: 2lb   Vitals Temp: 97.9 F (36.6 C) BP: 127/86 Pulse Rate: 83 SpO2: 98 %   Anthropometric Measurements Height: 5\' 4"  (1.626 m) Weight: 163 lb (73.9 kg) BMI (Calculated): 27.97 Weight at Last Visit: 161lb Weight Lost Since Last Visit: 0lb Weight Gained Since Last Visit: 2lb Starting Weight: 203lb Total Weight Loss (lbs): 40 lb (18.1 kg)   Body Composition  Body Fat %: 33.7 % Fat Mass (lbs): 55 lbs Muscle Mass (lbs): 103 lbs Total Body Water (lbs): 70.6 lbs Visceral Fat Rating : 9   Other Clinical Data Fasting: Yes Labs: Yes Today's Visit #: 10 Starting Date: 11/26/22     HPI  Chief Complaint: OBESITY  Deanna Hale is here to discuss her progress with her obesity treatment plan. She is on the the Category 2 Plan and states she is following her eating plan approximately 80 % of the time. She states she is exercising 60 minutes 2-4 days per week.   Interval History:  Since last office visit she has gained 2 pounds.  She has been on vacation since her last visit. She is not tracking but is making healthier choices/watching what she is eating.   She is averaging around 80 grams of protein daily. She is snacking on cottage cheese, yogurt, cheese and [employer] chips.   Has been working in her yard, kayaking and [place].     Up 3 lbs water weight  Pharmacotherapy for weight loss: She is not currently taking medications  for medical weight loss.     Previous pharmacotherapy for medical weight loss:  None   Bariatric surgery:  Patient has not had bariatric surgery  Vit D deficiency  She is taking a MVI. Not taking Vit D.  Reports some fatigue.   Lab Results  Component Value Date   VD25OH 32.6 11/27/2022    Low Vit B12 Taking a MVI. Not taking Vit B12. Notes some fatigue.   PHYSICAL EXAM:  Blood  pressure 127/86, pulse 83, temperature 97.9 F (36.6 C), height 5\' 4"  (1.626 m), weight 163 lb (73.9 kg), SpO2 98%. Body mass index is 27.98 kg/m.  General: She is overweight, cooperative, alert, well developed, and in no acute distress. PSYCH: Has normal mood, affect and thought process.   Extremities: No edema.  Neurologic: No gross sensory or motor deficits. No tremors or fasciculations noted.    DIAGNOSTIC DATA REVIEWED:  BMET    Component Value Date/Time   NA 141 11/27/2022 1125   K 4.8 11/27/2022 1125   CL 103 11/27/2022 1125   CO2 25 11/27/2022 1125   GLUCOSE 97 11/27/2022 1125   BUN 13 11/27/2022 1125   CREATININE 0.99 11/27/2022 1125   CALCIUM 9.8 11/27/2022 1125   Lab Results  Component Value Date   HGBA1C 5.6 11/27/2022   Lab Results  Component Value Date   INSULIN  7.7 11/27/2022   Lab Results  Component Value Date   TSH 1.370 11/27/2022   CBC    Component Value Date/Time   WBC 6.9 11/27/2022 1125   WBC 8.2 03/16/2007 1646   RBC 5.21 11/27/2022 1125   RBC 4.82 03/16/2007 1646   HGB 15.2 11/27/2022 1125   HCT 46.0 11/27/2022 1125   PLT 281 11/27/2022 1125   MCV 88 11/27/2022 1125   MCH 29.2 11/27/2022 1125  MCHC 33.0 11/27/2022 1125   MCHC 34.4 03/16/2007 1646   RDW 13.0 11/27/2022 1125   Iron Studies No results found for: "IRON", "TIBC", "FERRITIN", "IRONPCTSAT" Lipid Panel     Component Value Date/Time   CHOL 253 (H) 11/27/2022 1125   TRIG 98 11/27/2022 1125   HDL 85 11/27/2022 1125   LDLCALC 151 (H) 11/27/2022 1125   Hepatic Function Panel     Component Value Date/Time   PROT 7.0 11/27/2022 1125   ALBUMIN 4.7 11/27/2022 1125   AST 21 11/27/2022 1125   ALT 17 11/27/2022 1125   ALKPHOS 107 11/27/2022 1125   BILITOT 0.4 11/27/2022 1125      Component Value Date/Time   TSH 1.370 11/27/2022 1125   Nutritional Lab Results  Component Value Date   VD25OH 32.6 11/27/2022     ASSESSMENT AND PLAN  TREATMENT PLAN FOR  OBESITY:  Recommended Dietary Goals  Deanna Hale has overall done well with weight loss and is in maintenance phase.    Behavioral Intervention  We discussed the following Behavioral Modification Strategies today: increasing lean protein intake to established goals, decreasing simple carbohydrates , increasing vegetables, increasing fiber rich foods, increasing water intake , work on meal planning and preparation, practice mindfulness eating and understand the difference between hunger signals and cravings, continue to practice mindfulness when eating, and planning for success.  Additional resources provided today: NA  Recommended Physical Activity Goals  Deanna Hale has been advised to work up to 150 minutes of moderate intensity aerobic activity a week and strengthening exercises 2-3 times per week for cardiovascular health, weight loss maintenance and preservation of muscle mass.   She has agreed to Continue current level of physical activity    ASSOCIATED CONDITIONS ADDRESSED TODAY  Action/Plan  Elevated cholesterol -     Lipid Panel With LDL/HDL Ratio  Low vitamin B12 level -     Vitamin B12  Vitamin D  deficiency -     VITAMIN D  25 Hydroxy (Vit-D Deficiency, Fractures)  Fatigue, unspecified type -     Comprehensive metabolic panel with GFR -     VITAMIN D  25 Hydroxy (Vit-D Deficiency, Fractures) -     Vitamin B12 -     CBC with Differential/Platelet  Medication management -     Comprehensive metabolic panel with GFR  Generalized obesity  BMI 27.0-27.9,adult         Return in about 8 weeks (around 12/23/2023).Deanna Hale She was informed of the importance of frequent follow up visits to maximize her success with intensive lifestyle modifications for her multiple health conditions.   ATTESTASTION STATEMENTS:  Reviewed by clinician on day of visit: allergies, medications, problem list, medical history, surgical history, family history, social history, and previous encounter notes.      Crist Dominion. Deanna Bellantoni FNP-C

## 2023-10-29 LAB — CBC WITH DIFFERENTIAL/PLATELET
Basophils Absolute: 0.1 10*3/uL (ref 0.0–0.2)
Basos: 1 %
EOS (ABSOLUTE): 0.1 10*3/uL (ref 0.0–0.4)
Eos: 1 %
Hematocrit: 47.4 % — ABNORMAL HIGH (ref 34.0–46.6)
Hemoglobin: 15.6 g/dL (ref 11.1–15.9)
Immature Grans (Abs): 0 10*3/uL (ref 0.0–0.1)
Immature Granulocytes: 0 %
Lymphocytes Absolute: 1.9 10*3/uL (ref 0.7–3.1)
Lymphs: 32 %
MCH: 30.1 pg (ref 26.6–33.0)
MCHC: 32.9 g/dL (ref 31.5–35.7)
MCV: 92 fL (ref 79–97)
Monocytes Absolute: 0.5 10*3/uL (ref 0.1–0.9)
Monocytes: 8 %
Neutrophils Absolute: 3.4 10*3/uL (ref 1.4–7.0)
Neutrophils: 58 %
Platelets: 272 10*3/uL (ref 150–450)
RBC: 5.18 x10E6/uL (ref 3.77–5.28)
RDW: 13.4 % (ref 11.7–15.4)
WBC: 6 10*3/uL (ref 3.4–10.8)

## 2023-10-29 LAB — LIPID PANEL WITH LDL/HDL RATIO
Cholesterol, Total: 235 mg/dL — ABNORMAL HIGH (ref 100–199)
HDL: 85 mg/dL (ref >39)
LDL Chol Calc (NIH): 137 mg/dL — ABNORMAL HIGH (ref 0–99)
LDL/HDL Ratio: 1.6 ratio (ref 0.0–3.2)
Triglycerides: 75 mg/dL (ref 0–149)
VLDL Cholesterol Cal: 13 mg/dL (ref 5–40)

## 2023-10-29 LAB — COMPREHENSIVE METABOLIC PANEL WITH GFR
ALT: 23 IU/L (ref 0–32)
AST: 26 IU/L (ref 0–40)
Albumin: 4.6 g/dL (ref 3.9–4.9)
Alkaline Phosphatase: 97 IU/L (ref 44–121)
BUN/Creatinine Ratio: 21 (ref 12–28)
BUN: 20 mg/dL (ref 8–27)
Bilirubin Total: 0.4 mg/dL (ref 0.0–1.2)
CO2: 23 mmol/L (ref 20–29)
Calcium: 9.6 mg/dL (ref 8.7–10.3)
Chloride: 104 mmol/L (ref 96–106)
Creatinine, Ser: 0.94 mg/dL (ref 0.57–1.00)
Globulin, Total: 2 g/dL (ref 1.5–4.5)
Glucose: 82 mg/dL (ref 70–99)
Potassium: 4.7 mmol/L (ref 3.5–5.2)
Sodium: 141 mmol/L (ref 134–144)
Total Protein: 6.6 g/dL (ref 6.0–8.5)
eGFR: 66 mL/min/{1.73_m2} (ref >59)

## 2023-10-29 LAB — VITAMIN B12: Vitamin B-12: 686 pg/mL (ref 232–1245)

## 2023-10-29 LAB — VITAMIN D 25 HYDROXY (VIT D DEFICIENCY, FRACTURES): Vit D, 25-Hydroxy: 40.6 ng/mL (ref 30.0–100.0)

## 2024-01-06 ENCOUNTER — Ambulatory Visit: Admitting: Nurse Practitioner

## 2024-01-07 ENCOUNTER — Encounter: Payer: Self-pay | Admitting: Nurse Practitioner

## 2024-01-07 ENCOUNTER — Ambulatory Visit (INDEPENDENT_AMBULATORY_CARE_PROVIDER_SITE_OTHER): Admitting: Nurse Practitioner

## 2024-01-07 VITALS — BP 120/83 | HR 92 | Temp 98.3°F | Ht 64.0 in | Wt 166.0 lb

## 2024-01-07 DIAGNOSIS — Z6828 Body mass index (BMI) 28.0-28.9, adult: Secondary | ICD-10-CM | POA: Diagnosis not present

## 2024-01-07 DIAGNOSIS — E669 Obesity, unspecified: Secondary | ICD-10-CM | POA: Diagnosis not present

## 2024-01-07 DIAGNOSIS — E7800 Pure hypercholesterolemia, unspecified: Secondary | ICD-10-CM | POA: Diagnosis not present

## 2024-01-07 NOTE — Progress Notes (Signed)
 Office: [phone]  /  Fax: [phone]  WEIGHT SUMMARY AND BIOMETRICS  Weight Lost Since Last Visit: 0  Weight Gained Since Last Visit: 3lb   Vitals Temp: 98.3 F (36.8 C) BP: 120/83 Pulse Rate: 92 SpO2: 98 %   Anthropometric Measurements Height: 5' 4 (1.626 m) Weight: 166 lb (75.3 kg) BMI (Calculated): 28.48 Weight at Last Visit: 163lb Weight Lost Since Last Visit: 0 Weight Gained Since Last Visit: 3lb Starting Weight: 203lb Total Weight Loss (lbs): 37 lb (16.8 kg)   Body Composition  Body Fat %: 31.2 % Fat Mass (lbs): 52 lbs Muscle Mass (lbs): 108.6 lbs Total Body Water (lbs): 71.8 lbs Visceral Fat Rating : 9   Other Clinical Data Fasting: no Labs: no Today's Visit #: 11 Starting Date: 11/27/22     HPI  Chief Complaint: OBESITY  Deanna Hale is here to discuss her progress with her obesity treatment plan. She is on the the Category 2 Plan and states she is following her eating plan approximately 50 % of the time. She states she is exercising 30-50 minutes 3-5 days per week.   Interval History:  Since last office visit she has gained 3 pounds. She feels that things are going well.  She tends to weigh between 161-164 lbs.  She has been adding back more food-bread.  She has been making her own yogurt.   She has gone to [place] to help her sister after having surgery.  She took a class for basket weaving, she is painting, reading, etc.  Has been going [place] 2-3 days per week and is walking 4-5 days per week.   She notes she is overall feeling better.  Her knee pain has improved.    Pharmacotherapy for weight loss: She is not currently taking medications  for medical weight loss.     Previous pharmacotherapy for medical weight loss:  None   Bariatric surgery:  Patient has not had bariatric surgery  Hyperlipidemia Medication(s): none. Has appt with PCP on 04/05/24  Lab Results  Component Value Date   CHOL 235 (H) 10/28/2023   HDL 85 10/28/2023    LDLCALC 137 (H) 10/28/2023   TRIG 75 10/28/2023   Lab Results  Component Value Date   ALT 23 10/28/2023   AST 26 10/28/2023   ALKPHOS 97 10/28/2023   BILITOT 0.4 10/28/2023   The 10-year ASCVD risk score (Arnett DK, et al., 2019) is: 8.1%   Values used to calculate the score:     Age: 68 years     Clincally relevant sex: Female     Is Non-Hispanic African American: No     Diabetic: No     Tobacco smoker: No     Systolic Blood Pressure: 120 mmHg     Is BP treated: No     HDL Cholesterol: 85 mg/dL     Total Cholesterol: 235 mg/dL     PHYSICAL EXAM:  Blood pressure 120/83, pulse 92, temperature 98.3 F (36.8 C), height 5' 4 (1.626 m), weight 166 lb (75.3 kg), SpO2 98%. Body mass index is 28.49 kg/m.  General: She is overweight, cooperative, alert, well developed, and in no acute distress. PSYCH: Has normal mood, affect and thought process.   Extremities: No edema.  Neurologic: No gross sensory or motor deficits. No tremors or fasciculations noted.    DIAGNOSTIC DATA REVIEWED:  BMET    Component Value Date/Time   NA 141 10/28/2023 1128   K 4.7 10/28/2023 1128   CL 104  10/28/2023 1128   CO2 23 10/28/2023 1128   GLUCOSE 82 10/28/2023 1128   BUN 20 10/28/2023 1128   CREATININE 0.94 10/28/2023 1128   CALCIUM 9.6 10/28/2023 1128   Lab Results  Component Value Date   HGBA1C 5.6 11/27/2022   Lab Results  Component Value Date   INSULIN  7.7 11/27/2022   Lab Results  Component Value Date   TSH 1.370 11/27/2022   CBC    Component Value Date/Time   WBC 6.0 10/28/2023 1128   WBC 8.2 03/16/2007 1646   RBC 5.18 10/28/2023 1128   RBC 4.82 03/16/2007 1646   HGB 15.6 10/28/2023 1128   HCT 47.4 (H) 10/28/2023 1128   PLT 272 10/28/2023 1128   MCV 92 10/28/2023 1128   MCH 30.1 10/28/2023 1128   MCHC 32.9 10/28/2023 1128   MCHC 34.4 03/16/2007 1646   RDW 13.4 10/28/2023 1128   Iron Studies No results found for: IRON, TIBC, FERRITIN, IRONPCTSAT Lipid Panel      Component Value Date/Time   CHOL 235 (H) 10/28/2023 1128   TRIG 75 10/28/2023 1128   HDL 85 10/28/2023 1128   LDLCALC 137 (H) 10/28/2023 1128   Hepatic Function Panel     Component Value Date/Time   PROT 6.6 10/28/2023 1128   ALBUMIN 4.6 10/28/2023 1128   AST 26 10/28/2023 1128   ALT 23 10/28/2023 1128   ALKPHOS 97 10/28/2023 1128   BILITOT 0.4 10/28/2023 1128      Component Value Date/Time   TSH 1.370 11/27/2022 1125   Nutritional Lab Results  Component Value Date   VD25OH 40.6 10/28/2023   VD25OH 32.6 11/27/2022     ASSESSMENT AND PLAN  TREATMENT PLAN FOR OBESITY:  Recommended Dietary Goals Jocee has overall done well with weight loss and is in maintenance phase.    Behavioral Intervention  We discussed the following Behavioral Modification Strategies today: increasing lean protein intake to established goals, decreasing simple carbohydrates , increasing vegetables, increasing water intake , work on meal planning and preparation, and planning for success.  Additional resources provided today: NA  Recommended Physical Activity Goals  Ariyel has been advised to work up to 150 minutes of moderate intensity aerobic activity a week and strengthening exercises 2-3 times per week for cardiovascular health, weight loss maintenance and preservation of muscle mass.   She has agreed to Continue current level of physical activity , Think about enjoyable ways to increase daily physical activity and overcoming barriers to exercise, Increase physical activity in their day and reduce sedentary time (increase NEAT)., and continue to gradually increase the amount and intensity of exercise routine    ASSOCIATED CONDITIONS ADDRESSED TODAY  Action/Plan  Elevated cholesterol Cardiovascular risk and specific lipid/LDL goals reviewed.  We discussed several lifestyle modifications today and Nataliah will continue to work on diet, exercise and weight loss efforts. Orders and follow  up as documented in patient record.   Counseling Intensive lifestyle modifications are the first line treatment for this issue. Dietary changes: Increase soluble fiber. Decrease simple carbohydrates. Exercise changes: Moderate to vigorous-intensity aerobic activity 150 minutes per week if tolerated. Lipid-lowering medications: see documented in medical record.   Keep appointment with PCP to discuss in further detail  Generalized obesity  BMI 28.0-28.9,adult       Labs reviewed with patient from 10/28/23  Return in about 4 months (around 05/09/2024).SABRA She was informed of the importance of frequent follow up visits to maximize her success with intensive lifestyle modifications for her multiple  health conditions.   ATTESTASTION STATEMENTS:  Reviewed by clinician on day of visit: allergies, medications, problem list, medical history, surgical history, family history, social history, and previous encounter notes.   Time spent on visit including pre-visit chart review and post-visit care and charting was 30 minutes.    Corean SAUNDERS. Avalene Sealy FNP-C

## 2024-01-08 ENCOUNTER — Ambulatory Visit: Admitting: Nurse Practitioner

## 2024-05-10 ENCOUNTER — Ambulatory Visit: Admitting: Nurse Practitioner

## 2024-05-10 ENCOUNTER — Encounter: Payer: Self-pay | Admitting: Nurse Practitioner

## 2024-05-10 VITALS — BP 108/70 | HR 78 | Temp 98.3°F | Ht 64.0 in | Wt 170.0 lb

## 2024-05-10 DIAGNOSIS — Z6829 Body mass index (BMI) 29.0-29.9, adult: Secondary | ICD-10-CM | POA: Diagnosis not present

## 2024-05-10 DIAGNOSIS — E669 Obesity, unspecified: Secondary | ICD-10-CM | POA: Diagnosis not present

## 2024-05-10 DIAGNOSIS — E7800 Pure hypercholesterolemia, unspecified: Secondary | ICD-10-CM

## 2024-05-10 NOTE — Progress Notes (Signed)
 Office: [phone]  /  Fax: [phone]  WEIGHT SUMMARY AND BIOMETRICS  Weight Lost Since Last Visit: 0lb  Weight Gained Since Last Visit: 4lb   Vitals Temp: 98.3 F (36.8 C) BP: 108/70 Pulse Rate: 78 SpO2: 98 %   Anthropometric Measurements Height: 5' 4 (1.626 m) Weight: 170 lb (77.1 kg) BMI (Calculated): 29.17 Weight at Last Visit: 166lb Weight Lost Since Last Visit: 0lb Weight Gained Since Last Visit: 4lb Starting Weight: 203lb Total Weight Loss (lbs): 33 lb (15 kg)   Body Composition  Body Fat %: 39.4 % Fat Mass (lbs): 67.2 lbs Muscle Mass (lbs): 98 lbs Total Body Water (lbs): 70.6 lbs Visceral Fat Rating : 11   Other Clinical Data Fasting: No Labs: No Today's Visit #: 12 Starting Date: 11/27/22     HPI  Chief Complaint: OBESITY  Deanna Hale is here to discuss her progress with her obesity treatment plan. She is on the the Category 2 Plan and states she is following her eating plan approximately 50 % of the time. She states she is exercising 50+ minutes 2 days per week.   Interval History:  Since last office visit on 01/07/24 she has gained 4 pounds.  She feels that everything is going fine.  She is not currently tracking.  She notes she got off track over thanksgiving.  She is trying to meet her protein goals.  She is drinking cranberry juice 5 calories with water, coffee and a protein shake daily.  Has been going to piliates 2 days per week and is walking 2 days per week.   She is going [employer] in February.  BF:  atkins protein shake (30 grams of protein) and coffee Snack:   Lunch:  yogurt (15 grams of protein) or cottage cheese or soup or sting cheese Snack:  none Dinner:  salad with tomato, walnuts, feta, dried cranberries with olive oil Snack:  quest chips or skinny pop or string cheese Supplements:  collagen once per day (18 grams of protein)   Pharmacotherapy for weight loss: She is not currently taking medications  for medical weight  loss.     Previous pharmacotherapy for medical weight loss:  None   Bariatric surgery:  Patient has not had bariatric surgery  Hyperlipidemia Medication(s): None. Doesn't want to start a medication at this time.  FH: sister  Lab Results  Component Value Date   CHOL 235 (H) 10/28/2023   HDL 85 10/28/2023   LDLCALC 137 (H) 10/28/2023   TRIG 75 10/28/2023   Lab Results  Component Value Date   ALT 23 10/28/2023   AST 26 10/28/2023   ALKPHOS 97 10/28/2023   BILITOT 0.4 10/28/2023   The 10-year ASCVD risk score (Arnett DK, et al., 2019) is: 6.7%   Values used to calculate the score:     Age: 68 years     Clincally relevant sex: Female     Is Non-Hispanic African American: No     Diabetic: No     Tobacco smoker: No     Systolic Blood Pressure: 108 mmHg     Is BP treated: No     HDL Cholesterol: 85 mg/dL     Total Cholesterol: 235 mg/dL   PHYSICAL EXAM:  Blood pressure 108/70, pulse 78, temperature 98.3 F (36.8 C), height 5' 4 (1.626 m), weight 170 lb (77.1 kg), SpO2 98%. Body mass index is 29.18 kg/m.  General: She is overweight, cooperative, alert, well developed, and in no acute distress. PSYCH: Has  normal mood, affect and thought process.   Extremities: No edema.  Neurologic: No gross sensory or motor deficits. No tremors or fasciculations noted.    DIAGNOSTIC DATA REVIEWED:  BMET    Component Value Date/Time   NA 141 10/28/2023 1128   K 4.7 10/28/2023 1128   CL 104 10/28/2023 1128   CO2 23 10/28/2023 1128   GLUCOSE 82 10/28/2023 1128   BUN 20 10/28/2023 1128   CREATININE 0.94 10/28/2023 1128   CALCIUM 9.6 10/28/2023 1128   Lab Results  Component Value Date   HGBA1C 5.6 11/27/2022   Lab Results  Component Value Date   INSULIN  7.7 11/27/2022   Lab Results  Component Value Date   TSH 1.370 11/27/2022   CBC    Component Value Date/Time   WBC 6.0 10/28/2023 1128   WBC 8.2 03/16/2007 1646   RBC 5.18 10/28/2023 1128   RBC 4.82 03/16/2007 1646    HGB 15.6 10/28/2023 1128   HCT 47.4 (H) 10/28/2023 1128   PLT 272 10/28/2023 1128   MCV 92 10/28/2023 1128   MCH 30.1 10/28/2023 1128   MCHC 32.9 10/28/2023 1128   MCHC 34.4 03/16/2007 1646   RDW 13.4 10/28/2023 1128   Iron Studies No results found for: IRON, TIBC, FERRITIN, IRONPCTSAT Lipid Panel     Component Value Date/Time   CHOL 235 (H) 10/28/2023 1128   TRIG 75 10/28/2023 1128   HDL 85 10/28/2023 1128   LDLCALC 137 (H) 10/28/2023 1128   Hepatic Function Panel     Component Value Date/Time   PROT 6.6 10/28/2023 1128   ALBUMIN 4.6 10/28/2023 1128   AST 26 10/28/2023 1128   ALT 23 10/28/2023 1128   ALKPHOS 97 10/28/2023 1128   BILITOT 0.4 10/28/2023 1128      Component Value Date/Time   TSH 1.370 11/27/2022 1125   Nutritional Lab Results  Component Value Date   VD25OH 40.6 10/28/2023   VD25OH 32.6 11/27/2022     ASSESSMENT AND PLAN  TREATMENT PLAN FOR OBESITY:  Recommended Dietary Goals  Deanna Hale is currently in the action stage of change. As such, her goal is to continue weight management plan. She has agreed to keeping a food journal and adhering to recommended goals of 1200 calories and 100 grams of protein.  To track and will review macros at her next visit.  Meal plan using A1 printed out for patient today.    Behavioral Intervention  We discussed the following Behavioral Modification Strategies today: increasing lean protein intake to established goals, decreasing simple carbohydrates , increasing vegetables, increasing fiber rich foods, increasing water intake , work on tracking and journaling calories using tracking application, continue to work on maintaining a reduced calorie state, getting the recommended amount of protein, incorporating whole foods, making healthy choices, staying well hydrated and practicing mindfulness when eating., and increase protein intake, fibrous foods (25 grams per day for women, 30 grams for men) and water to improve  satiety and decrease hunger signals. .  Additional resources provided today: NA  Recommended Physical Activity Goals  Deanna Hale has been advised to work up to 150 minutes of moderate intensity aerobic activity a week and strengthening exercises 2-3 times per week for cardiovascular health, weight loss maintenance and preservation of muscle mass.   She has agreed to Think about enjoyable ways to increase daily physical activity and overcoming barriers to exercise, Increase physical activity in their day and reduce sedentary time (increase NEAT)., Start strengthening exercises with a goal of  2-3 sessions a week , Continue to gradually increase the amount and intensity of exercise routine, Increase volume of physical activity to a goal of 240 minutes a week, and Combine aerobic and strengthening exercises for efficiency and improved cardiometabolic health.    ASSOCIATED CONDITIONS ADDRESSED TODAY  Action/Plan  Elevated cholesterol Cardiovascular risk and specific lipid/LDL goals reviewed.  We discussed several lifestyle modifications today and Zinnia will continue to work on diet, exercise and weight loss efforts. Orders and follow up as documented in patient record.   Counseling Intensive lifestyle modifications are the first line treatment for this issue. Dietary changes: Increase soluble fiber. Decrease simple carbohydrates. Exercise changes: Moderate to vigorous-intensity aerobic activity 150 minutes per week if tolerated. Lipid-lowering medications: see documented in medical record.   Not interested in starting any mediations  Generalized obesity  BMI 29.0-29.9,adult     Would benefit from referral to RD  Will check labs at next visit  Return in about 2 months (around 07/11/2024).SABRA She was informed of the importance of frequent follow up visits to maximize her success with intensive lifestyle modifications for her multiple health conditions.   ATTESTASTION STATEMENTS:  Reviewed by  clinician on day of visit: allergies, medications, problem list, medical history, surgical history, family history, social history, and previous encounter notes.   I personally spent a total of 51 minutes in the care of the patient today including preparing to see the patient, getting/reviewing separately obtained history, performing a medically appropriate exam/evaluation, counseling and educating, and documenting clinical information in the EHR.    Corean SAUNDERS. Takeira Yanes FNP-C

## 2024-07-12 ENCOUNTER — Ambulatory Visit: Admitting: Nurse Practitioner

## 2024-07-26 ENCOUNTER — Ambulatory Visit: Admitting: Nurse Practitioner
